# Patient Record
Sex: Female | Born: 1935 | Race: White | Hispanic: No | Smoking: Never smoker
Health system: Southern US, Community
[De-identification: ages and names within clinical notes are randomized; demographics above are authoritative.]

## PROBLEM LIST (undated history)

## (undated) DIAGNOSIS — F32A Depression, unspecified: Secondary | ICD-10-CM

## (undated) DIAGNOSIS — I10 Essential (primary) hypertension: Secondary | ICD-10-CM

## (undated) DIAGNOSIS — M720 Palmar fascial fibromatosis [Dupuytren]: Secondary | ICD-10-CM

## (undated) DIAGNOSIS — I499 Cardiac arrhythmia, unspecified: Secondary | ICD-10-CM

## (undated) DIAGNOSIS — I4892 Unspecified atrial flutter: Secondary | ICD-10-CM

## (undated) DIAGNOSIS — N200 Calculus of kidney: Secondary | ICD-10-CM

## (undated) DIAGNOSIS — F329 Major depressive disorder, single episode, unspecified: Secondary | ICD-10-CM

## (undated) HISTORY — PX: ABDOMINAL HYSTERECTOMY: SHX81

## (undated) HISTORY — PX: DUPUYTREN CONTRACTURE RELEASE: SHX1478

## (undated) HISTORY — DX: Unspecified atrial flutter: I48.92

---

## 2004-09-06 ENCOUNTER — Ambulatory Visit (HOSPITAL_COMMUNITY): Admission: RE | Admit: 2004-09-06 | Discharge: 2004-09-06 | Payer: Self-pay | Admitting: Ophthalmology

## 2005-02-07 ENCOUNTER — Ambulatory Visit (HOSPITAL_COMMUNITY): Admission: RE | Admit: 2005-02-07 | Discharge: 2005-02-07 | Payer: Self-pay | Admitting: Ophthalmology

## 2005-07-12 ENCOUNTER — Encounter: Admission: RE | Admit: 2005-07-12 | Discharge: 2005-07-12 | Payer: Self-pay | Admitting: Orthopedic Surgery

## 2005-07-13 ENCOUNTER — Ambulatory Visit (HOSPITAL_BASED_OUTPATIENT_CLINIC_OR_DEPARTMENT_OTHER): Admission: RE | Admit: 2005-07-13 | Discharge: 2005-07-13 | Payer: Self-pay | Admitting: Orthopedic Surgery

## 2005-08-03 ENCOUNTER — Encounter (HOSPITAL_COMMUNITY): Admission: RE | Admit: 2005-08-03 | Discharge: 2005-09-02 | Payer: Self-pay | Admitting: Orthopedic Surgery

## 2011-08-02 ENCOUNTER — Emergency Department (HOSPITAL_COMMUNITY)
Admission: EM | Admit: 2011-08-02 | Discharge: 2011-08-02 | Disposition: A | Discharge status: Home Health | Payer: Medicare Other | Attending: Emergency Medicine | Admitting: Emergency Medicine

## 2011-08-02 ENCOUNTER — Other Ambulatory Visit: Payer: Self-pay

## 2011-08-02 ENCOUNTER — Emergency Department (HOSPITAL_COMMUNITY): Payer: Medicare Other

## 2011-08-02 ENCOUNTER — Encounter (HOSPITAL_COMMUNITY): Payer: Self-pay

## 2011-08-02 DIAGNOSIS — R42 Dizziness and giddiness: Secondary | ICD-10-CM | POA: Insufficient documentation

## 2011-08-02 DIAGNOSIS — I498 Other specified cardiac arrhythmias: Secondary | ICD-10-CM | POA: Insufficient documentation

## 2011-08-02 DIAGNOSIS — I10 Essential (primary) hypertension: Secondary | ICD-10-CM | POA: Insufficient documentation

## 2011-08-02 DIAGNOSIS — R61 Generalized hyperhidrosis: Secondary | ICD-10-CM | POA: Insufficient documentation

## 2011-08-02 DIAGNOSIS — R079 Chest pain, unspecified: Secondary | ICD-10-CM | POA: Insufficient documentation

## 2011-08-02 HISTORY — DX: Essential (primary) hypertension: I10

## 2011-08-02 HISTORY — DX: Cardiac arrhythmia, unspecified: I49.9

## 2011-08-02 HISTORY — DX: Major depressive disorder, single episode, unspecified: F32.9

## 2011-08-02 HISTORY — DX: Depression, unspecified: F32.A

## 2011-08-02 LAB — BASIC METABOLIC PANEL
BUN: 19 mg/dL (ref 6–23)
CO2: 29 mEq/L (ref 19–32)
Calcium: 9.8 mg/dL (ref 8.4–10.5)
Chloride: 103 mEq/L (ref 96–112)
Creatinine, Ser: 0.67 mg/dL (ref 0.50–1.10)
GFR calc Af Amer: >90 mL/min (ref >90)
GFR calc non Af Amer: 79 mL/min — ABNORMAL LOW (ref >90)
Glucose, Bld: 152 mg/dL — ABNORMAL HIGH (ref 70–99)
Potassium: 4.2 mEq/L (ref 3.5–5.1)
Sodium: 140 mEq/L (ref 135–145)

## 2011-08-02 LAB — CBC
HCT: 38.7 % (ref 36.0–46.0)
Hemoglobin: 13.1 g/dL (ref 12.0–15.0)
MCH: 31.7 pg (ref 26.0–34.0)
MCHC: 33.9 g/dL (ref 30.0–36.0)
MCV: 93.7 fL (ref 78.0–100.0)
Platelets: 201 10*3/uL (ref 150–400)
RBC: 4.13 MIL/uL (ref 3.87–5.11)
RDW: 12.6 % (ref 11.5–15.5)
WBC: 7.6 10*3/uL (ref 4.0–10.5)

## 2011-08-02 LAB — TROPONIN I: Troponin I: <0.3 ng/mL (ref <0.30)

## 2011-08-02 MED ORDER — OXYCODONE-ACETAMINOPHEN 5-325 MG PO TABS
1.0000 | ORAL_TABLET | ORAL | Status: AC | PRN
Start: 2011-08-02 — End: 2011-08-12

## 2011-08-02 NOTE — ED Notes (Signed)
Pt reports severe chest pain under breast after eating.  Says became dizzy and diaphoretic  For a few mintues when pain started.    Says pain is severe in ribs.

## 2011-08-02 NOTE — Discharge Instructions (Signed)
Chest Pain, Nonspecific  It is often hard to give a specific diagnosis for the cause of chest pain. There is always a chance that your pain could be related to something serious, like a heart attack or a blood clot in the lungs. You need to follow up with your caregiver for further evaluation. More lab tests or other studies such as X-rays, electrocardiography, stress testing, or cardiac imaging may be needed to find the cause of your pain.  Most of the time, nonspecific chest pain improves within 2 to 3 days with rest and mild pain medicine. For the next few days, avoid physical exertion or activities that bring on pain. Do not smoke. Avoid drinking alcohol. Call your caregiver for routine follow-up as advised.   SEEK IMMEDIATE MEDICAL CARE IF:   You develop increased chest pain or pain that radiates to the arm, neck, jaw, back, or abdomen.   You develop shortness of breath, increased coughing, or you start coughing up blood.   You have severe back or abdominal pain, nausea, or vomiting.   You develop severe weakness, fainting, fever, or chills.  Document Released: 05/09/2005 Document Revised: 04/28/2011 Document Reviewed: 10/27/2006  [employer] Patient Information 2012 [employer].

## 2011-08-02 NOTE — ED Notes (Signed)
Pt DC to home with family via wc

## 2011-08-02 NOTE — ED Provider Notes (Signed)
History   This chart was scribed for Raeford Razor, MD by Clarita Crane. The patient was seen in room APA11/APA11. Patient's care was started at 1351.   CSN: [id number]  Arrival date & time 08/02/11  1351   First MD Initiated Contact with Patient 08/02/11 1414      Chief Complaint  Patient presents with  . Chest Pain    (Consider location/radiation/quality/duration/timing/severity/associated sxs/prior treatment) HPI [name] is a 78 y.o. female who presents to the Emergency Department complaining of constant moderate to severe chest pain localized to region under breasts onset this morning after eating with associated dizziness and diaphoresis which lasted for a few minutes after onset of chest pain and then resolved on its own. Denies SOB, nausea, vomiting, fever, chills, cough. Patient with h/o HTN, irregular heart beat.  Past Medical History  Diagnosis Date  . Hypertension   . Irregular heart beat   . Depression     Past Surgical History  Procedure Date  . Abdominal hysterectomy     No family history on file.  History  Substance Use Topics  . Smoking status: Never Smoker   . Smokeless tobacco: Not on file  . Alcohol Use: No    OB History    Grav Para Term Preterm Abortions TAB SAB Ect Mult Living                  Review of Systems 10 Systems reviewed and are negative for acute change except as noted in the HPI.  Allergies  Penicillins  Home Medications  No current outpatient prescriptions on file.  BP 140/66  Pulse 68  Temp(Src) 97.5 F (36.4 C) (Oral)  Resp 22  Ht 5\' 5"  (1.651 m)  Wt 133 lb (60.328 kg)  BMI 22.13 kg/m2  SpO2 99%  Physical Exam  Nursing note and vitals reviewed. Constitutional: She is oriented to person, place, and time. She appears well-developed and well-nourished. No distress.  HENT:  Head: Normocephalic and atraumatic.  Eyes: EOM are normal. Pupils are equal, round, and reactive to light.  Neck: Neck supple. No  tracheal deviation present.  Cardiovascular: Normal rate and regular rhythm.  Exam reveals no gallop and no friction rub.   No murmur heard. Pulmonary/Chest: Effort normal. No respiratory distress. She has no wheezes. She has no rales. She exhibits no tenderness.  Abdominal: Soft. She exhibits no distension. There is no tenderness.  Musculoskeletal: Normal range of motion. She exhibits no edema.  Neurological: She is alert and oriented to person, place, and time. No sensory deficit.  Skin: Skin is warm and dry.  Psychiatric: She has a normal mood and affect. Her behavior is normal.    ED Course  Procedures (including critical care time)  DIAGNOSTIC STUDIES: Oxygen Saturation is 99% on room air, normal by my interpretation.    COORDINATION OF CARE: 2:26PM- Patient informed of current plan for treatment and evaluation and agrees with plan at this time.     Labs Reviewed - No data to display No results found.  Dg Chest 2 View  08/02/2011  *RADIOLOGY REPORT*  Clinical Data: Chest pain after eating earlier today  CHEST - 2 VIEW  Comparison: 07/12/2005  Findings: Grossly unchanged cardiac silhouette and mediastinal contours with atherosclerotic calcifications within the aortic arch.  The lungs remain mildly hyperinflated.  Unchanged minimal left basilar opacities favored to represent atelectasis or scar. There is grossly unchanged biapical pleural parenchymal thickening. No definite pneumothorax or pleural effusion.  Unchanged bones including  multilevel thoracic spine degenerative change.  IMPRESSION: Hyperexpanded lungs without acute cardiopulmonary disease.  Original Report Authenticated By: Waynard Reeds, M.D.    1. Chest pain       MDM  78 year old female with chest pain shortly after eating. Consider ACS but doubt. Symptoms atypical. EKG non-provocative. Trop normal. cxr without focal acute findings. Possible hiatal hernia. Etiology unclear but doubt emergent etiology. Return  precautions discussed. Although doubt cardiac inetiology pt was encouraged to follow-up with her cardiologist since she has has not been to see recently and last stress test over 10y ago.       I personally preformed the services scribed in my presence. The recorded information has been reviewed and considered. Raeford Razor, MD.    Raeford Razor, MD 08/07/11 [phone]

## 2011-08-02 NOTE — ED Notes (Signed)
Patient in bed, placed patient on cardiac monitor. Family at bedside at this time. No needs voiced by patient.

## 2012-02-16 ENCOUNTER — Other Ambulatory Visit (HOSPITAL_COMMUNITY): Payer: Self-pay | Admitting: Internal Medicine

## 2012-02-16 DIAGNOSIS — R1011 Right upper quadrant pain: Secondary | ICD-10-CM

## 2012-02-21 ENCOUNTER — Other Ambulatory Visit (HOSPITAL_COMMUNITY): Payer: Self-pay | Admitting: Internal Medicine

## 2012-02-21 ENCOUNTER — Ambulatory Visit (HOSPITAL_COMMUNITY)
Admission: RE | Admit: 2012-02-21 | Discharge: 2012-02-21 | Disposition: A | Discharge status: Home / Self Care | Payer: Medicare Other | Source: Ambulatory Visit | Attending: Internal Medicine | Admitting: Internal Medicine

## 2012-02-21 DIAGNOSIS — R1011 Right upper quadrant pain: Secondary | ICD-10-CM | POA: Insufficient documentation

## 2012-02-21 DIAGNOSIS — K8020 Calculus of gallbladder without cholecystitis without obstruction: Secondary | ICD-10-CM | POA: Insufficient documentation

## 2012-02-21 DIAGNOSIS — K828 Other specified diseases of gallbladder: Secondary | ICD-10-CM | POA: Insufficient documentation

## 2012-03-01 NOTE — H&P (Signed)
  NTS SOAP Note  Vital Signs:  Vitals as of: 03/01/2012: Systolic 150: Diastolic 75: Heart Rate 92: Temp 57F: Height 87ft 5in: Weight 128Lbs 0 Ounces: BMI 21  BMI : 21.47 kg/m2  Subjective: This 78 Years 58 Months old Female presents for of biliary colic.  Has had two episodes of biliary colic.  U/S of gallbladder reveals cholelithiasis.  Does have fatty food intolerance.  No fever, chills, jaundice. .. Review of Symptoms:  Constitutional:unremarkable   Head:unremarkable    Eyes:unremarkable   sinus problems Cardiovascular:  unremarkable   Respiratory:unremarkable   Genitourinary:unremarkable     Musculoskeletal:unremarkable   Skin:unremarkable Hematolgic/Lymphatic:unremarkable     Allergic/Immunologic:unremarkable     Past Medical History:    Reviewed   Past Medical History  Surgical History: hysterectomy Medical Problems:  High Blood pressure, High cholesterol Allergies: pcn Medications: diltiazem, paroxetine, lisinopril, baby asa   Social History:Reviewed  Social History  Preferred Language: English ([place]) Race:  White Ethnicity: Not Hispanic / Latino Age: 78 Years 3 Months Alcohol:  No Recreational drug(s):  No   Smoking Status: Never smoker reviewed on 03/01/2012  Family History:  Reviewed   Family History  Is there a family history YN:[id number]    Objective Information: General:  Well appearing, well nourished in no distress.   no scleral icterus Heart:  RRR, no murmur or gallop.  Normal S1, S2.  No S3, S4.  Lungs:    CTA bilaterally, no wheezes, rhonchi, rales.  Breathing unlabored. Abdomen:Soft, NT/ND, normal bowel sounds, no HSM, no masses.  No peritoneal signs.  Assessment:Biliary colic, cholelithiasis  Diagnosis &amp; Procedure: DiagnosisCode: 574.20, ProcedureCode: 78469,    Plan:Scheduled for laparoscopic cholecystectomy on 03/19/12.   Patient  Education:Alternative treatments to surgery were discussed with patient (and family).  Risks and benefits  of procedure were fully explained to the patient (and family) who gave informed consent. Patient/family questions were addressed.  Follow-up:Pending Surgery

## 2012-03-07 ENCOUNTER — Encounter (HOSPITAL_COMMUNITY): Payer: Self-pay

## 2012-03-13 ENCOUNTER — Encounter (HOSPITAL_COMMUNITY)
Admission: RE | Admit: 2012-03-13 | Discharge: 2012-03-13 | Disposition: A | Discharge status: Home / Self Care | Payer: Medicare Other | Source: Ambulatory Visit | Attending: General Surgery | Admitting: General Surgery

## 2012-03-13 ENCOUNTER — Encounter (HOSPITAL_COMMUNITY): Payer: Self-pay

## 2012-03-13 HISTORY — DX: Palmar fascial fibromatosis (dupuytren): M72.0

## 2012-03-13 LAB — BASIC METABOLIC PANEL
BUN: 16 mg/dL (ref 6–23)
CO2: 28 mEq/L (ref 19–32)
Calcium: 9.9 mg/dL (ref 8.4–10.5)
Chloride: 102 mEq/L (ref 96–112)
Creatinine, Ser: 0.69 mg/dL (ref 0.50–1.10)

## 2012-03-13 LAB — CBC WITH DIFFERENTIAL/PLATELET
Basophils Absolute: 0 10*3/uL (ref 0.0–0.1)
Basophils Relative: 1 % (ref 0–1)
Eosinophils Absolute: 0.2 10*3/uL (ref 0.0–0.7)
Eosinophils Relative: 3 % (ref 0–5)
HCT: 39.5 % (ref 36.0–46.0)
MCH: 31.6 pg (ref 26.0–34.0)
MCHC: 33.2 g/dL (ref 30.0–36.0)
MCV: 95.4 fL (ref 78.0–100.0)
Monocytes Absolute: 0.4 10*3/uL (ref 0.1–1.0)
RDW: 13.3 % (ref 11.5–15.5)

## 2012-03-13 LAB — SURGICAL PCR SCREEN
MRSA, PCR: NEGATIVE
Staphylococcus aureus: NEGATIVE

## 2012-03-13 LAB — HEPATIC FUNCTION PANEL
ALT: 263 U/L — ABNORMAL HIGH (ref 0–35)
Bilirubin, Direct: 0.7 mg/dL — ABNORMAL HIGH (ref 0.0–0.3)
Total Protein: 6.8 g/dL (ref 6.0–8.3)

## 2012-03-13 NOTE — Patient Instructions (Addendum)
20 Rickia P Knippenberg  03/13/2012   Your procedure is scheduled on:  03/19/12  Report to [facility] at 0900 AM.  Call this number if you have problems the morning of surgery: [phone]   Remember:   Do not eat food:After Midnight.  May have clear liquids:until Midnight .  Clear liquids include soda, tea, black coffee, apple or grape juice, broth.  Take these medicines the morning of surgery with A SIP OF WATER: paxil, diltiazem, lisinopril   Do not wear jewelry, make-up or nail polish.  Do not wear lotions, powders, or perfumes. You may wear deodorant.  Do not shave 48 hours prior to surgery. Men may shave face and neck.  Do not bring valuables to the hospital.  Contacts, dentures or bridgework may not be worn into surgery.  Leave suitcase in the car. After surgery it may be brought to your room.  For patients admitted to the hospital, checkout time is 11:00 AM the day of discharge.   Patients discharged the day of surgery will not be allowed to drive home.  Name and phone number of your driver: family  Special Instructions: Shower using CHG 2 nights before surgery and the night before surgery.  If you shower the day of surgery use CHG.  Use special wash - you have one bottle of CHG for all showers.  You should use approximately 1/3 of the bottle for each shower.   Please read over the following fact sheets that you were given: Pain Booklet, MRSA Information, Surgical Site Infection Prevention, Anesthesia Post-op Instructions and Care and Recovery After Surgery   PATIENT INSTRUCTIONS POST-ANESTHESIA  IMMEDIATELY FOLLOWING SURGERY:  Do not drive or operate machinery for the first twenty four hours after surgery.  Do not make any important decisions for twenty four hours after surgery or while taking narcotic pain medications or sedatives.  If you develop intractable nausea and vomiting or a severe headache please notify your doctor immediately.  FOLLOW-UP:  Please make an appointment with  your surgeon as instructed. You do not need to follow up with anesthesia unless specifically instructed to do so.  WOUND CARE INSTRUCTIONS (if applicable):  Keep a dry clean dressing on the anesthesia/puncture wound site if there is drainage.  Once the wound has quit draining you may leave it open to air.  Generally you should leave the bandage intact for twenty four hours unless there is drainage.  If the epidural site drains for more than 36-48 hours please call the anesthesia department.  QUESTIONS?:  Please feel free to call your physician or the hospital operator if you have any questions, and they will be happy to assist you.      Laparoscopic Cholecystectomy Laparoscopic cholecystectomy is surgery to remove the gallbladder. The gallbladder is located slightly to the right of center in the abdomen, behind the liver. It is a concentrating and storage sac for the bile produced in the liver. Bile aids in the digestion and absorption of fats. Gallbladder disease (cholecystitis) is an inflammation of your gallbladder. This condition is usually caused by a buildup of gallstones (cholelithiasis) in your gallbladder. Gallstones can block the flow of bile, resulting in inflammation and pain. In severe cases, emergency surgery may be required. When emergency surgery is not required, you will have time to prepare for the procedure. Laparoscopic surgery is an alternative to open surgery. Laparoscopic surgery usually has a shorter recovery time. Your common bile duct may also need to be examined and explored. Your caregiver  will discuss this with you if he or she feels this should be done. If stones are found in the common bile duct, they may be removed. LET YOUR CAREGIVER KNOW ABOUT:  Allergies to food or medicine.  Medicines taken, including vitamins, herbs, eyedrops, over-the-counter medicines, and creams.  Use of steroids (by mouth or creams).  Previous problems with anesthetics or numbing  medicines.  History of bleeding problems or blood clots.  Previous surgery.  Other health problems, including diabetes and kidney problems.  Possibility of pregnancy, if this applies. RISKS AND COMPLICATIONS All surgery is associated with risks. Some problems that may occur following this procedure include:  Infection.  Damage to the common bile duct, nerves, arteries, veins, or other internal organs such as the stomach or intestines.  Bleeding.  A stone may remain in the common bile duct. BEFORE THE PROCEDURE  Do not take aspirin for 3 days prior to surgery or blood thinners for 1 week prior to surgery.  Do not eat or drink anything after midnight the night before surgery.  Let your caregiver know if you develop a cold or other infectious problem prior to surgery.  You should be present 60 minutes before the procedure or as directed. PROCEDURE  You will be given medicine that makes you sleep (general anesthetic). When you are asleep, your surgeon will make several small cuts (incisions) in your abdomen. One of these incisions is used to insert a small, lighted scope (laparoscope) into the abdomen. The laparoscope helps the surgeon see into your abdomen. Carbon dioxide gas will be pumped into your abdomen. The gas allows more room for the surgeon to perform your surgery. Other operating instruments are inserted through the other incisions. Laparoscopic procedures may not be appropriate when:  There is major scarring from previous surgery.  The gallbladder is extremely inflamed.  There are bleeding disorders or unexpected cirrhosis of the liver.  A pregnancy is near term.  Other conditions make the laparoscopic procedure impossible. If your surgeon feels it is not safe to continue with a laparoscopic procedure, he or she will perform an open abdominal procedure. In this case, the surgeon will make an incision to open the abdomen. This gives the surgeon a larger view and field  to work within. This may allow the surgeon to perform procedures that sometimes cannot be performed with a laparoscope alone. Open surgery has a longer recovery time. AFTER THE PROCEDURE  You will be taken to the recovery area where a nurse will watch and check your progress.  You may be allowed to go home the same day.  Do not resume physical activities until directed by your caregiver.  You may resume a normal diet and activities as directed. Document Released: 05/09/2005 Document Revised: 08/01/2011 Document Reviewed: 10/22/2010 [facility] Patient Information 2013 [place], [place].

## 2012-03-14 NOTE — Progress Notes (Signed)
Dr. Lovell Sheehan aware of elevated LFTs.  Dr. Lovell Sheehan added intraoperative cholangiogram to order & consent.

## 2012-03-19 ENCOUNTER — Encounter (HOSPITAL_COMMUNITY)
Admission: RE | Disposition: A | Discharge status: Home Health | Payer: Self-pay | Source: Ambulatory Visit | Attending: General Surgery

## 2012-03-19 ENCOUNTER — Encounter (HOSPITAL_COMMUNITY): Payer: Self-pay | Admitting: Anesthesiology

## 2012-03-19 ENCOUNTER — Inpatient Hospital Stay (HOSPITAL_COMMUNITY)
Admission: RE | Admit: 2012-03-19 | Discharge: 2012-03-22 | DRG: 419 | Disposition: A | Discharge status: Home Health | Payer: Medicare Other | Source: Ambulatory Visit | Attending: General Surgery | Admitting: General Surgery

## 2012-03-19 ENCOUNTER — Encounter (HOSPITAL_COMMUNITY): Payer: Self-pay | Admitting: *Deleted

## 2012-03-19 ENCOUNTER — Ambulatory Visit (HOSPITAL_COMMUNITY): Payer: Medicare Other

## 2012-03-19 ENCOUNTER — Ambulatory Visit (HOSPITAL_COMMUNITY): Payer: Medicare Other | Admitting: Anesthesiology

## 2012-03-19 DIAGNOSIS — E7800 Pure hypercholesterolemia, unspecified: Secondary | ICD-10-CM | POA: Diagnosis present

## 2012-03-19 DIAGNOSIS — Z79899 Other long term (current) drug therapy: Secondary | ICD-10-CM

## 2012-03-19 DIAGNOSIS — K8060 Calculus of gallbladder and bile duct with cholecystitis, unspecified, without obstruction: Principal | ICD-10-CM | POA: Diagnosis present

## 2012-03-19 DIAGNOSIS — F329 Major depressive disorder, single episode, unspecified: Secondary | ICD-10-CM | POA: Diagnosis present

## 2012-03-19 DIAGNOSIS — F3289 Other specified depressive episodes: Secondary | ICD-10-CM | POA: Diagnosis present

## 2012-03-19 DIAGNOSIS — M720 Palmar fascial fibromatosis [Dupuytren]: Secondary | ICD-10-CM | POA: Diagnosis present

## 2012-03-19 DIAGNOSIS — Z90710 Acquired absence of both cervix and uterus: Secondary | ICD-10-CM

## 2012-03-19 DIAGNOSIS — Z7982 Long term (current) use of aspirin: Secondary | ICD-10-CM

## 2012-03-19 DIAGNOSIS — Z880 Allergy status to penicillin: Secondary | ICD-10-CM

## 2012-03-19 DIAGNOSIS — I10 Essential (primary) hypertension: Secondary | ICD-10-CM | POA: Diagnosis present

## 2012-03-19 DIAGNOSIS — K8064 Calculus of gallbladder and bile duct with chronic cholecystitis without obstruction: Principal | ICD-10-CM | POA: Diagnosis present

## 2012-03-19 DIAGNOSIS — E638 Other specified nutritional deficiencies: Secondary | ICD-10-CM | POA: Diagnosis present

## 2012-03-19 HISTORY — PX: CHOLECYSTECTOMY: SHX55

## 2012-03-19 HISTORY — PX: LAPAROSCOPIC CHOLECYSTECTOMY: SUR755

## 2012-03-19 LAB — HEPATIC FUNCTION PANEL
AST: 151 U/L — ABNORMAL HIGH (ref 0–37)
Albumin: 3.3 g/dL — ABNORMAL LOW (ref 3.5–5.2)
Alkaline Phosphatase: 300 U/L — ABNORMAL HIGH (ref 39–117)
Total Protein: 6.1 g/dL (ref 6.0–8.3)

## 2012-03-19 LAB — GLUCOSE, CAPILLARY: Glucose-Capillary: 191 mg/dL — ABNORMAL HIGH (ref 70–99)

## 2012-03-19 LAB — BASIC METABOLIC PANEL
Calcium: 9 mg/dL (ref 8.4–10.5)
Glucose, Bld: 196 mg/dL — ABNORMAL HIGH (ref 70–99)

## 2012-03-19 SURGERY — LAPAROSCOPIC CHOLECYSTECTOMY WITH INTRAOPERATIVE CHOLANGIOGRAM
Anesthesia: General | Wound class: Clean Contaminated

## 2012-03-19 MED ORDER — PAROXETINE HCL 20 MG PO TABS
10.0000 mg | ORAL_TABLET | Freq: Every day | ORAL | Status: DC
  Administered 2012-03-20 – 2012-03-22 (×3): 10 mg via ORAL
  Filled 2012-03-19: qty 2
  Filled 2012-03-19 (×2): qty 1

## 2012-03-19 MED ORDER — GLYCOPYRROLATE 0.2 MG/ML IJ SOLN
INTRAMUSCULAR | Status: AC
  Filled 2012-03-19: qty 1

## 2012-03-19 MED ORDER — NEOSTIGMINE METHYLSULFATE 1 MG/ML IJ SOLN
INTRAMUSCULAR | Status: DC | PRN
  Administered 2012-03-19: 4 mg via INTRAVENOUS

## 2012-03-19 MED ORDER — MIDAZOLAM HCL 2 MG/2ML IJ SOLN
INTRAMUSCULAR | Status: AC
  Filled 2012-03-19: qty 2

## 2012-03-19 MED ORDER — PANTOPRAZOLE SODIUM 40 MG IV SOLR
40.0000 mg | INTRAVENOUS | Status: DC
  Administered 2012-03-19 – 2012-03-21 (×3): 40 mg via INTRAVENOUS
  Filled 2012-03-19 (×3): qty 40

## 2012-03-19 MED ORDER — ONDANSETRON HCL 4 MG/2ML IJ SOLN
4.0000 mg | Freq: Once | INTRAMUSCULAR | Status: AC
  Administered 2012-03-19: 4 mg via INTRAVENOUS

## 2012-03-19 MED ORDER — BUPIVACAINE HCL (PF) 0.5 % IJ SOLN
INTRAMUSCULAR | Status: AC
  Filled 2012-03-19: qty 30

## 2012-03-19 MED ORDER — ACETAMINOPHEN 10 MG/ML IV SOLN
INTRAVENOUS | Status: AC
  Filled 2012-03-19: qty 100

## 2012-03-19 MED ORDER — MIDAZOLAM HCL 2 MG/2ML IJ SOLN
1.0000 mg | INTRAMUSCULAR | Status: DC | PRN
  Administered 2012-03-19: 2 mg via INTRAVENOUS

## 2012-03-19 MED ORDER — IOHEXOL 350 MG/ML SOLN
INTRAVENOUS | Status: DC | PRN
  Administered 2012-03-19 (×4): 50 mL

## 2012-03-19 MED ORDER — FENTANYL CITRATE 0.05 MG/ML IJ SOLN
INTRAMUSCULAR | Status: AC
  Filled 2012-03-19: qty 2

## 2012-03-19 MED ORDER — CHLORHEXIDINE GLUCONATE 4 % EX LIQD: 1.0000 "application " | Freq: Once | CUTANEOUS | Status: DC

## 2012-03-19 MED ORDER — FENTANYL CITRATE 0.05 MG/ML IJ SOLN
INTRAMUSCULAR | Status: DC | PRN
  Administered 2012-03-19: 25 ug via INTRAVENOUS
  Administered 2012-03-19: 50 ug via INTRAVENOUS
  Administered 2012-03-19: 25 ug via INTRAVENOUS
  Administered 2012-03-19: 50 ug via INTRAVENOUS
  Administered 2012-03-19 (×2): 25 ug via INTRAVENOUS

## 2012-03-19 MED ORDER — METOPROLOL TARTRATE 1 MG/ML IV SOLN
INTRAVENOUS | Status: DC | PRN
  Administered 2012-03-19 (×2): 1 mg via INTRAVENOUS

## 2012-03-19 MED ORDER — GLYCOPYRROLATE 0.2 MG/ML IJ SOLN
INTRAMUSCULAR | Status: AC
  Filled 2012-03-19: qty 3

## 2012-03-19 MED ORDER — KCL IN DEXTROSE-NACL 40-5-0.45 MEQ/L-%-% IV SOLN
INTRAVENOUS | Status: DC
  Administered 2012-03-19 – 2012-03-21 (×4): via INTRAVENOUS

## 2012-03-19 MED ORDER — VANCOMYCIN HCL IN DEXTROSE 1-5 GM/200ML-% IV SOLN: 1000.0000 mg | INTRAVENOUS | Status: DC

## 2012-03-19 MED ORDER — FENTANYL CITRATE 0.05 MG/ML IJ SOLN
25.0000 ug | INTRAMUSCULAR | Status: DC | PRN
  Administered 2012-03-19 (×2): 50 ug via INTRAVENOUS

## 2012-03-19 MED ORDER — VANCOMYCIN HCL IN DEXTROSE 1-5 GM/200ML-% IV SOLN
INTRAVENOUS | Status: AC
  Filled 2012-03-19: qty 200

## 2012-03-19 MED ORDER — ACETAMINOPHEN 10 MG/ML IV SOLN
1000.0000 mg | Freq: Four times a day (QID) | INTRAVENOUS | Status: AC
  Administered 2012-03-19 – 2012-03-20 (×4): 1000 mg via INTRAVENOUS
  Filled 2012-03-19 (×3): qty 100

## 2012-03-19 MED ORDER — NEOSTIGMINE METHYLSULFATE 1 MG/ML IJ SOLN
INTRAMUSCULAR | Status: AC
  Filled 2012-03-19: qty 10

## 2012-03-19 MED ORDER — GLYCOPYRROLATE 0.2 MG/ML IJ SOLN
INTRAMUSCULAR | Status: DC | PRN
  Administered 2012-03-19: 0.6 mg via INTRAVENOUS

## 2012-03-19 MED ORDER — LIDOCAINE HCL (CARDIAC) 10 MG/ML IV SOLN
INTRAVENOUS | Status: DC | PRN
  Administered 2012-03-19: 50 mg via INTRAVENOUS

## 2012-03-19 MED ORDER — VANCOMYCIN HCL 1000 MG IV SOLR
1000.0000 mg | INTRAVENOUS | Status: DC | PRN
  Administered 2012-03-19: 1000 mg via INTRAVENOUS

## 2012-03-19 MED ORDER — LIDOCAINE HCL (PF) 1 % IJ SOLN
INTRAMUSCULAR | Status: AC
  Filled 2012-03-19: qty 2

## 2012-03-19 MED ORDER — PROPOFOL 10 MG/ML IV EMUL
INTRAVENOUS | Status: AC
  Filled 2012-03-19: qty 20

## 2012-03-19 MED ORDER — ONDANSETRON HCL 4 MG/2ML IJ SOLN: 4.0000 mg | Freq: Once | INTRAMUSCULAR | Status: DC | PRN

## 2012-03-19 MED ORDER — ROCURONIUM BROMIDE 50 MG/5ML IV SOLN
INTRAVENOUS | Status: AC
  Filled 2012-03-19: qty 1

## 2012-03-19 MED ORDER — GLUCAGON HCL (RDNA) 1 MG IJ SOLR
INTRAMUSCULAR | Status: DC | PRN
  Administered 2012-03-19: 1 mg via INTRAVENOUS

## 2012-03-19 MED ORDER — ENOXAPARIN SODIUM 40 MG/0.4ML ~~LOC~~ SOLN: 40.0000 mg | SUBCUTANEOUS | Status: DC

## 2012-03-19 MED ORDER — HEMOSTATIC AGENTS (NO CHARGE) OPTIME
TOPICAL | Status: DC | PRN
  Administered 2012-03-19 (×2): 1 via TOPICAL

## 2012-03-19 MED ORDER — MORPHINE SULFATE 2 MG/ML IJ SOLN: 1.0000 mg | INTRAMUSCULAR | Status: DC | PRN

## 2012-03-19 MED ORDER — HYDROCODONE-ACETAMINOPHEN 5-325 MG PO TABS: 1.0000 | ORAL_TABLET | ORAL | Status: DC | PRN

## 2012-03-19 MED ORDER — ROCURONIUM BROMIDE 100 MG/10ML IV SOLN
INTRAVENOUS | Status: DC | PRN
  Administered 2012-03-19: 30 mg via INTRAVENOUS
  Administered 2012-03-19: 5 mg via INTRAVENOUS

## 2012-03-19 MED ORDER — DILTIAZEM HCL ER COATED BEADS 240 MG PO CP24
240.0000 mg | ORAL_CAPSULE | Freq: Every day | ORAL | Status: DC
  Administered 2012-03-20 – 2012-03-21 (×2): 240 mg via ORAL
  Filled 2012-03-19 (×3): qty 1

## 2012-03-19 MED ORDER — LACTATED RINGERS IV SOLN
INTRAVENOUS | Status: DC
  Administered 2012-03-19: 10:00:00 via INTRAVENOUS

## 2012-03-19 MED ORDER — LACTATED RINGERS IV SOLN
INTRAVENOUS | Status: DC | PRN
  Administered 2012-03-19 (×2): via INTRAVENOUS

## 2012-03-19 MED ORDER — ONDANSETRON HCL 4 MG PO TABS: 4.0000 mg | ORAL_TABLET | Freq: Four times a day (QID) | ORAL | Status: DC | PRN

## 2012-03-19 MED ORDER — LIDOCAINE HCL (PF) 1 % IJ SOLN
INTRAMUSCULAR | Status: AC
  Filled 2012-03-19: qty 5

## 2012-03-19 MED ORDER — PROPOFOL 10 MG/ML IV EMUL
INTRAVENOUS | Status: DC | PRN
  Administered 2012-03-19: 80 mg via INTRAVENOUS

## 2012-03-19 MED ORDER — ONDANSETRON HCL 4 MG/2ML IJ SOLN
4.0000 mg | Freq: Four times a day (QID) | INTRAMUSCULAR | Status: DC | PRN
  Administered 2012-03-20: 4 mg via INTRAVENOUS
  Filled 2012-03-19: qty 2

## 2012-03-19 MED ORDER — LISINOPRIL 10 MG PO TABS
40.0000 mg | ORAL_TABLET | Freq: Every day | ORAL | Status: DC
  Administered 2012-03-20 – 2012-03-21 (×2): 40 mg via ORAL
  Filled 2012-03-19 (×3): qty 4

## 2012-03-19 MED ORDER — 0.9 % SODIUM CHLORIDE (POUR BTL) OPTIME
TOPICAL | Status: DC | PRN
  Administered 2012-03-19 (×2): 1000 mL

## 2012-03-19 MED ORDER — GLYCOPYRROLATE 0.2 MG/ML IJ SOLN
0.2000 mg | Freq: Once | INTRAMUSCULAR | Status: AC
  Administered 2012-03-19: 0.2 mg via INTRAVENOUS

## 2012-03-19 SURGICAL SUPPLY — 50 items
0.5% SENSORCAINE 30ML IMPLANT
APPLIER CLIP ROT 10 11.4 M/L (STAPLE) ×2
APR CLP MED LRG 11.4X10 (STAPLE) ×1
BAG HAMPER (MISCELLANEOUS) ×2 IMPLANT
BAG SPEC RTRVL LRG 6X4 10 (ENDOMECHANICALS) ×1
BASKET LAP CBDE (MISCELLANEOUS) ×1 IMPLANT
CATH CHOLANGIOGRAM 4.5FR (CATHETERS) ×2 IMPLANT
CLIP APPLIE ROT 10 11.4 M/L (STAPLE) ×1 IMPLANT
CLOTH BEACON ORANGE TIMEOUT ST (SAFETY) ×2 IMPLANT
COVER LIGHT HANDLE STERIS (MISCELLANEOUS) ×4 IMPLANT
COVER MAYO STAND XLG (DRAPE) ×2 IMPLANT
CUTTER LINEAR ENDO 35 ETS TH (STAPLE) ×1 IMPLANT
DECANTER SPIKE VIAL GLASS SM (MISCELLANEOUS) ×2 IMPLANT
DISSECTOR BLUNT TIP ENDO 5MM (MISCELLANEOUS) IMPLANT
DRAPE C-ARM FOLDED MOBILE STRL (DRAPES) ×2 IMPLANT
DURAPREP 26ML APPLICATOR (WOUND CARE) ×2 IMPLANT
ELECT REM PT RETURN 9FT ADLT (ELECTROSURGICAL) ×2
ELECTRODE REM PT RTRN 9FT ADLT (ELECTROSURGICAL) ×1 IMPLANT
FILTER SMOKE EVAC LAPAROSHD (FILTER) ×2 IMPLANT
FORMALIN 10 PREFIL 120ML (MISCELLANEOUS) ×2 IMPLANT
GLOVE BIO SURGEON STRL SZ7.5 (GLOVE) ×2 IMPLANT
GLOVE BIOGEL PI IND STRL 7.0 (GLOVE) IMPLANT
GLOVE BIOGEL PI IND STRL 7.5 (GLOVE) IMPLANT
GLOVE BIOGEL PI INDICATOR 7.0 (GLOVE) ×3
GLOVE BIOGEL PI INDICATOR 7.5 (GLOVE) ×2
GLOVE ECLIPSE 7.0 STRL STRAW (GLOVE) ×2 IMPLANT
GLOVE SS BIOGEL STRL SZ 6.5 (GLOVE) IMPLANT
GLOVE SUPERSENSE BIOGEL SZ 6.5 (GLOVE) ×3
GOWN STRL REIN XL XLG (GOWN DISPOSABLE) ×7 IMPLANT
HEMOSTAT SNOW SURGICEL 2X4 (HEMOSTASIS) ×3 IMPLANT
INST SET LAPROSCOPIC AP (KITS) ×2 IMPLANT
KIT ROOM TURNOVER APOR (KITS) ×2 IMPLANT
KIT TROCAR LAP CHOLE (TROCAR) ×2 IMPLANT
MANIFOLD NEPTUNE II (INSTRUMENTS) ×2 IMPLANT
NS IRRIG 1000ML POUR BTL (IV SOLUTION) ×2 IMPLANT
PACK LAP CHOLE LZT030E (CUSTOM PROCEDURE TRAY) ×2 IMPLANT
PAD ARMBOARD 7.5X6 YLW CONV (MISCELLANEOUS) ×2 IMPLANT
PENCIL FOOT CONTROL (ELECTRODE) ×1 IMPLANT
PENCIL HANDSWITCHING (ELECTRODE) ×1 IMPLANT
POUCH SPECIMEN RETRIEVAL 10MM (ENDOMECHANICALS) ×2 IMPLANT
SET BASIN LINEN APH (SET/KITS/TRAYS/PACK) ×2 IMPLANT
SET TUBE IRRIG SUCTION NO TIP (IRRIGATION / IRRIGATOR) IMPLANT
SPONGE GAUZE 2X2 8PLY STRL LF (GAUZE/BANDAGES/DRESSINGS) ×8 IMPLANT
STAPLER VISISTAT (STAPLE) ×2 IMPLANT
SUT VICRYL 0 UR6 27IN ABS (SUTURE) ×3 IMPLANT
SYR 20CC LL (SYRINGE) ×2 IMPLANT
SYR 30ML LL (SYRINGE) ×2 IMPLANT
TAPE CLOTH SURG 4X10 WHT LF (GAUZE/BANDAGES/DRESSINGS) ×1 IMPLANT
WARMER LAPAROSCOPE (MISCELLANEOUS) ×2 IMPLANT
YANKAUER SUCT 12FT TUBE ARGYLE (SUCTIONS) ×2 IMPLANT

## 2012-03-19 NOTE — Progress Notes (Signed)
Dr. Lovell Sheehan paged regarding order for telemetry monitoring. No return page at this time.

## 2012-03-19 NOTE — Anesthesia Procedure Notes (Signed)
Procedure Name: Intubation Date/Time: 03/19/2012 11:06 AM Performed by: Carolyne Littles, Jaidyn Usery L Pre-anesthesia Checklist: Patient identified, Patient being monitored, Timeout performed, Emergency Drugs available and Suction available Patient Re-evaluated:Patient Re-evaluated prior to inductionOxygen Delivery Method: Circle System Utilized Preoxygenation: Pre-oxygenation with 100% oxygen Intubation Type: IV induction Ventilation: Mask ventilation without difficulty Laryngoscope Size: 3 and Miller Grade View: Grade I Tube type: Oral Tube size: 7.0 mm Number of attempts: 1 Airway Equipment and Method: stylet Placement Confirmation: ETT inserted through vocal cords under direct vision,  positive ETCO2 and breath sounds checked- equal and bilateral Secured at: 21 cm Tube secured with: Tape Dental Injury: Teeth and Oropharynx as per pre-operative assessment

## 2012-03-19 NOTE — Progress Notes (Signed)
b-met and calcium drawn per lab.

## 2012-03-19 NOTE — Progress Notes (Signed)
EKG done

## 2012-03-19 NOTE — Preoperative (Signed)
Beta Blockers   Reason not to administer Beta Blockers:Not Applicable 

## 2012-03-19 NOTE — Interval H&P Note (Signed)
History and Physical Interval Note:  03/19/2012 9:36 AM  [name]  has presented today for surgery, with the diagnosis of Cholelithiasis  The various methods of treatment have been discussed with the patient and family. After consideration of risks, benefits and other options for treatment, the patient has consented to  Procedure(s) (LRB) with comments: LAPAROSCOPIC CHOLECYSTECTOMY WITH INTRAOPERATIVE CHOLANGIOGRAM (N/A) as a surgical intervention .  The patient's history has been reviewed, patient examined, no change in status, stable for surgery.  I have reviewed the patient's chart and labs.  Questions were answered to the patient's satisfaction.     Franky Macho A

## 2012-03-19 NOTE — Anesthesia Preprocedure Evaluation (Signed)
Anesthesia Evaluation  Patient identified by MRN, date of birth, ID band Patient awake    Reviewed: Allergy & Precautions, H&P , NPO status , Patient's Chart, lab work & pertinent test results  History of Anesthesia Complications Negative for: history of anesthetic complications  Airway Mallampati: II      Dental  (+) Teeth Intact   Pulmonary neg pulmonary ROS,  breath sounds clear to auscultation        Cardiovascular hypertension, Pt. on medications Rhythm:Regular Rate:Normal     Neuro/Psych PSYCHIATRIC DISORDERS Depression    GI/Hepatic   Endo/Other  diabetes (borderline)  Renal/GU      Musculoskeletal   Abdominal   Peds  Hematology   Anesthesia Other Findings   Reproductive/Obstetrics                           Anesthesia Physical Anesthesia Plan  ASA: III  Anesthesia Plan: General   Post-op Pain Management:    Induction: Intravenous  Airway Management Planned: Oral ETT  Additional Equipment:   Intra-op Plan:   Post-operative Plan: Extubation in OR  Informed Consent: I have reviewed the patients History and Physical, chart, labs and discussed the procedure including the risks, benefits and alternatives for the proposed anesthesia with the patient or authorized representative who has indicated his/her understanding and acceptance.     Plan Discussed with:   Anesthesia Plan Comments:         Anesthesia Quick Evaluation

## 2012-03-19 NOTE — Progress Notes (Signed)
Cardiac monitor. NSR with occasional PVC.

## 2012-03-19 NOTE — Op Note (Signed)
Patient:  [name]  DOB:  1925/07/24  MRN:  [id number]   Preop Diagnosis:  Cholecystitis, cholelithiasis  Postop Diagnosis:  Same, choledocholithiasis  Procedure:  Laparoscopic cholecystectomy with common bile duct exploration  Surgeon:  Franky Macho, M.D.  Anes:  General endotracheal  Indications:  Patient is an 78 year old white female who presents with biliary colic secondary to cholelithiasis. She was noted to have a dilated common bile duct on ultrasound, though no choledocholithiasis was seen. She does have a mildly elevated direct bilirubin. She now comes the operating room for laparoscopic cholecystectomy with intraoperative cholangiograms. The risks and benefits of the procedure including bleeding, infection, hepatobiliary injury, the possibly of an open procedure were fully explained to the patient, gave informed consent.  Procedure note:  The patient was placed in the supine position. After induction of general endotracheal anesthesia, the abdomen was prepped and draped using usual sterile technique with DuraPrep. Surgical site confirmation was performed.  A supraumbilical incision was made down the fascia. A Veress needle was introduced into the abdominal cavity and confirmation of placement was done using the saline drop test. The abdomen was insufflated to 16 mm mercury pressure. An 11 mm trocar was introduced into the abdominal cavity under direct visualization without difficulty. The patient was placed in reverse Trendelenburg position and additional 11 mm trocar was placed the epigastric region and 5 mm trochars were placed the right upper quadrant and right flank regions. The liver was inspected and noted within normal limits. The gallbladder was noted be distended and thickened with multiple stones present within the gallbladder lumen. The gallbladder was retracted in a dynamic fashion in order to facilitate identification of the triangle of Calot. The cystic duct was  first identified. Its junction to the infundibulum flow identified. He was noted to be significantly dilated down to a dilated common bile duct. A 'was placed proximally on the cystic duct. An incision was made into the cystic duct and a cholangiogram was performed under digital fluoroscopy. Multiple filling defects were noted within the common bile duct and hepatic radicals. The dye did flow into the duodenum. Glucagon 1 mg was given IV. Despite multiple attempts at flushing the system with normal saline as well as attempted trans-cystic basket retrieval, not all the stones could be cleared. Given the amount of stones and the size of the common bile duct, I felt it would better to complete the procedure laparoscopically and refer the patient for an ERCP postoperatively. A vascular Endo GIA was placed across the cystic duct and fired. The cystic artery was ligated and divided with clips. The gallbladder was freed away from the gallbladder fossa using Bovie electrocautery. The gallbladder was delivered through the epigastric trocar site using an Endo Catch bag. The gallbladder fossa was inspected no abnormal bleeding or bile leakage was noted. Surgicel is placed the gallbladder fossa. All fluid and air were then evacuated from the abdominal cavity prior to removal of the trochars.  All wounds were gave normal saline. The supraumbilical fascia as well as epigastric fascia reapproximated using 0 Vicryl interrupted sutures. All skin incisions were closed using staples. Betadine ointment and dry sterile dressings were applied.  All tape and needle counts were correct at the end of the procedure. Patient was extubated in the operating room and went to PACU in stable condition.  Of note was the fact that the patient had a long of possible V. tach. Labs in a 12-lead EKG will be checked. She'll be placed on telemetry  for further monitoring. No pressure change was noted throughout the procedure.  Complications:   None  EBL:  Minimal  Specimen:  Gallbladder with stones

## 2012-03-19 NOTE — Anesthesia Postprocedure Evaluation (Signed)
  Anesthesia Post-op Note  Patient: [name]  Procedure(s) Performed: Procedure(s) (LRB) with comments: LAPAROSCOPIC CHOLECYSTECTOMY WITH INTRAOPERATIVE CHOLANGIOGRAM (N/A) - with common duct exploration  Patient Location: PACU  Anesthesia Type: General  Level of Consciousness: awake, alert , oriented and patient cooperative  Airway and Oxygen Therapy: Patient Spontanous Breathing and Patient connected to face mask oxygen  Post-op Pain: mild  Post-op Assessment: Post-op Vital signs reviewed, Patient's Cardiovascular Status Stable, Respiratory Function Stable, Patent Airway and No signs of Nausea or vomiting  Post-op Vital Signs: Reviewed and stable  Complications: No apparent anesthesia complications

## 2012-03-19 NOTE — Progress Notes (Signed)
Sleeping quietly. resp adequate/nonlabored. O2 changed to nasal cannula. Tolerated well.

## 2012-03-19 NOTE — Transfer of Care (Signed)
  Anesthesia Post-op Note  Patient: [name]  Procedure(s) Performed: Procedure(s) (LRB) with comments: LAPAROSCOPIC CHOLECYSTECTOMY WITH INTRAOPERATIVE CHOLANGIOGRAM (N/A) - with common duct exploration  Patient Location: PACU  Anesthesia Type: General  Level of Consciousness: awake, alert , oriented and patient cooperative  Airway and Oxygen Therapy: Patient Spontanous Breathing and Patient connected to face mask oxygen  Post-op Pain: mild  Post-op Assessment: Post-op Vital signs reviewed, Patient's Cardiovascular Status Stable, Respiratory Function Stable, Patent Airway and No signs of Nausea or vomiting  Post-op Vital Signs: Reviewed and stable  Complications: No apparent anesthesia complications  

## 2012-03-19 NOTE — Interval H&P Note (Signed)
History and Physical Interval Note:  03/19/2012 8:59 AM  [name]  has presented today for surgery, with the diagnosis of Cholelithiasis  The various methods of treatment have been discussed with the patient and family. After consideration of risks, benefits and other options for treatment, the patient has consented to  Procedure(s) (LRB) with comments: LAPAROSCOPIC CHOLECYSTECTOMY WITH INTRAOPERATIVE CHOLANGIOGRAM (N/A) as a surgical intervention .  The patient's history has been reviewed, patient examined, no change in status, stable for surgery.  I have reviewed the patient's chart and labs.  Questions were answered to the patient's satisfaction.     Franky Macho A

## 2012-03-19 NOTE — Consult Note (Signed)
Referring Provider: No ref. provider found Primary Care Physician:  Carylon Perches, MD Primary Gastroenterologist:  Dr. Jena Gauss  Reason for Consultation:  Choledocholithiasis  HPI: [name] is a 78 y.o. female with biliary colic s/p laparoscopic cholecystectomy by Dr Lovell Sheehan today.  She had a positive intraoperative cholangiogram.  Contrast does pass into the small bowel.  Therefore, no complete obstruction.  Findings concerning for multiple retained common bile duct stones with CBD dilated.  Transaminases were elevated as below on 10/22.  Today's LFTs remain elevated, just slightly improved.  Pt is still somewhat sedated from procedure.  Pt states she has been having several months of abdominal pain that is worse with eating.  She thinks she has lost a lot of weight & had a poor appetite.  She denies fever, chills, jaundice or pruritis.    Past Medical History  Diagnosis Date  . Hypertension   . Irregular heart beat   . Depression   . Dupuytren's contracture of both hands     Past Surgical History  Procedure Date  . Abdominal hysterectomy   . Dupuytren contracture release     left hand  . Laparoscopic cholecystectomy 03/19/12    Lovell Sheehan, +IOC    Prior to Admission medications   Medication Sig Start Date End Date Taking? Authorizing Provider  diltiazem (CARDIZEM CD) 240 MG 24 hr capsule Take 1 capsule by mouth Daily. 02/20/12  Yes Historical Provider, MD  lisinopril (PRINIVIL,ZESTRIL) 40 MG tablet Take 40 mg by mouth daily.   Yes Historical Provider, MD  PARoxetine (PAXIL) 20 MG tablet Take 10 mg by mouth daily.    Yes Historical Provider, MD  aspirin EC 81 MG tablet Take 81 mg by mouth daily.    Historical Provider, MD  Polyethyl Glycol-Propyl Glycol (SYSTANE) 0.4-0.3 % SOLN Place 1 drop into both eyes daily as needed. For dry eyes    Historical Provider, MD  sodium chloride (OCEAN) 0.65 % nasal spray Place 1 spray into the nose as needed. For nasal dryness    Historical Provider, MD      Current Facility-Administered Medications  Medication Dose Route Frequency Provider Last Rate Last Dose  . acetaminophen (OFIRMEV) IV 1,000 mg  1,000 mg Intravenous Q6H Dalia Heading, MD   1,000 mg at 03/19/12 1334  . dextrose 5 % and 0.45 % NaCl with KCl 40 mEq/L infusion   Intravenous Continuous Dalia Heading, MD      . diltiazem (CARDIZEM CD) 24 hr capsule 240 mg  240 mg Oral Daily Dalia Heading, MD      . enoxaparin (LOVENOX) injection 40 mg  40 mg Subcutaneous Q24H Dalia Heading, MD      . glycopyrrolate (ROBINUL) injection 0.2 mg  0.2 mg Intravenous Once Laurene Footman, MD   0.2 mg at 03/19/12 1008  . HYDROcodone-acetaminophen (NORCO/VICODIN) 5-325 MG per tablet 1 tablet  1 tablet Oral Q4H PRN Dalia Heading, MD      . lisinopril (PRINIVIL,ZESTRIL) tablet 40 mg  40 mg Oral Daily Dalia Heading, MD      . morphine 2 MG/ML injection 1 mg  1 mg Intravenous Q3H PRN Dalia Heading, MD      . ondansetron [facility]) injection 4 mg  4 mg Intravenous Once Laurene Footman, MD   4 mg at 03/19/12 1009  . ondansetron (ZOFRAN) tablet 4 mg  4 mg Oral Q6H PRN Dalia Heading, MD       Or  . ondansetron [facility]) injection  4 mg  4 mg Intravenous Q6H PRN Dalia Heading, MD      . PARoxetine (PAXIL) tablet 10 mg  10 mg Oral Daily Dalia Heading, MD      . DISCONTD: 0.9 % irrigation (POUR BTL)    PRN Dalia Heading, MD   1,000 mL at 03/19/12 1215  . DISCONTD: chlorhexidine (HIBICLENS) 4 % liquid 1 application  1 application Topical Once Dalia Heading, MD      . DISCONTD: fentaNYL (SUBLIMAZE) injection 25-50 mcg  25-50 mcg Intravenous Q5 min PRN Laurene Footman, MD   50 mcg at 03/19/12 1340  . DISCONTD: hemostatic agents    PRN Dalia Heading, MD   1 application at 03/19/12 1239  . DISCONTD: iohexol (OMNIPAQUE) 350 MG/ML injection    PRN Dalia Heading, MD   50 mL at 03/19/12 1227  . DISCONTD: lactated ringers infusion   Intravenous Continuous Laurene Footman, MD 75 mL/hr at 03/19/12 1009    . DISCONTD:  midazolam (VERSED) injection 1-2 mg  1-2 mg Intravenous Q5 Min x 3 PRN Laurene Footman, MD   2 mg at 03/19/12 1009  . DISCONTD: ondansetron (ZOFRAN) injection 4 mg  4 mg Intravenous Once PRN Laurene Footman, MD      . DISCONTD: vancomycin (VANCOCIN) IVPB 1000 mg/200 mL premix  1,000 mg Intravenous On Call to OR Dalia Heading, MD       Facility-Administered Medications Ordered in Other Encounters  Medication Dose Route Frequency Provider Last Rate Last Dose  . DISCONTD: fentaNYL (SUBLIMAZE) injection    PRN Marolyn Hammock, CRNA   25 mcg at 03/19/12 1251  . DISCONTD: glucagon (GLUCAGEN) injection    PRN Amy Storm Frisk, CRNA   1 mg at 03/19/12 1155  . DISCONTD: glycopyrrolate (ROBINUL) injection    PRN Amy L Andraza, CRNA   0.6 mg at 03/19/12 1247  . DISCONTD: lactated ringers infusion    Continuous PRN Amy L Andraza, CRNA      . DISCONTD: lidocaine (cardiac) 50 mg/66ml (XYLOCAINE) injection 1%    PRN Amy L Andraza, CRNA   50 mg at 03/19/12 1104  . DISCONTD: metoprolol (LOPRESSOR) injection    PRN Amy Storm Frisk, CRNA   1 mg at 03/19/12 1250  . DISCONTD: neostigmine (PROSTIGMINE) injection   Intravenous PRN Amy Storm Frisk, CRNA   4 mg at 03/19/12 1247  . DISCONTD: propofol (DIPRIVAN) 10 mg/ml infusion    PRN Amy L Andraza, CRNA   80 mg at 03/19/12 1104  . DISCONTD: rocuronium (ZEMURON) injection    PRN Amy Storm Frisk, CRNA   5 mg at 03/19/12 1153  . DISCONTD: vancomycin (VANCOCIN) 1,000 mg in sodium chloride 0.9 % 250 mL IVPB  1,000 mg Intravenous Continuous PRN Amy L Andraza, CRNA   1,000 mg at 03/19/12 1050    Allergies as of 03/01/2012 - Review Complete 08/02/2011  Allergen Reaction Noted  . Penicillins  08/02/2011  . Procaine hcl  08/02/2011    History reviewed. No pertinent family history.  History   Social History  . Marital Status: Widowed    Spouse Name: N/A    Number of Children: N/A  . Years of Education: N/A   Occupational History  . Not on file.   Social History Main Topics  .  Smoking status: Never Smoker   . Smokeless tobacco: Not on file  . Alcohol Use: No  . Drug Use: No  . Sexually Active:  Review of Systems: Gen: Denies any fever, chills, sweats, anorexia, fatigue, weakness, malaise, weight loss, and sleep disorder CV: Denies chest pain, angina, palpitations, syncope, orthopnea, PND, peripheral edema, and claudication. Resp: Denies dyspnea at rest, dyspnea with exercise, cough, sputum, wheezing, coughing up blood, and pleurisy. GI: Denies vomiting blood or fecal incontinence.    GU : Denies urinary burning, blood in urine, urinary frequency, urinary hesitancy, nocturnal urination, and urinary incontinence. MS: Denies joint pain, limitation of movement, and swelling, stiffness, low back pain, extremity pain. Denies muscle weakness, cramps, atrophy.  Derm: Denies rash, itching, dry skin, hives, moles, warts, or unhealing ulcers.  Psych: Denies depression, anxiety, memory loss, suicidal ideation, hallucinations, paranoia, and confusion. Heme: Denies bruising, bleeding, and enlarged lymph nodes. Neuro:  Denies any headaches, dizziness, paresthesias. Endo:  Denies any problems with DM, thyroid, adrenal function.  Physical Exam: Vital signs in last 24 hours: Temp:  [97.3 F (36.3 C)-98 F (36.7 C)] 97.3 F (36.3 C) (10/28 1530) Pulse Rate:  [58-77] 63  (10/28 1530) Resp:  [9-58] 14  (10/28 1530) BP: (103-148)/(41-74) 122/61 mmHg (10/28 1530) SpO2:  [89 %-100 %] 98 % (10/28 1530) FiO2 (%):  [1.5 %] 1.5 % (10/28 1530) Weight:  [125 lb 14.1 oz (57.1 kg)] 125 lb 14.1 oz (57.1 kg) (10/28 1527) Last BM Date: 03/18/12 No LMP recorded. General:   Alert,  Well-developed, still somewhat sedated from procedure, pleasant and cooperative in NAD Head:  Normocephalic and atraumatic. Eyes:  Sclera clear, no icterus.   Conjunctiva pink. Ears:  Normal auditory acuity. Nose:  No deformity, discharge, or lesions. Mouth:  No deformity or lesions,oropharynx pink &  moist. Neck:  Supple; no masses or thyromegaly. Lungs:  Clear throughout to auscultation.   No wheezes, crackles, or rhonchi. No acute distress. Heart:  Regular rate and rhythm; no murmurs, clicks, rubs,  or gallops. Abdomen:  +dressings intact.  No drainage noted.  Decreased bowel sounds.  No bruits.  Soft, non-tender and non-distended without masses.  No guarding or rebound tenderness.   Rectal:  Deferred. Msk:  Symmetrical without gross deformities Pulses:  Normal pulses noted. Extremities:  No edema. Neurologic:  Alert and oriented x4;  grossly normal neurologically. Skin:  Intact without significant lesions or rashes. Lymph Nodes:  No significant cervical adenopathy. Psych:  Alert and cooperative. Normal mood and affect.  Intake/Output from previous day:   Intake/Output this shift: Total I/O In: 1000 [I.V.:1000] Out: -   Lab Results: No results found for this basename: WBC:3,HGB:3,HCT:3,PLT:3 in the last 72 hours BMET  [facility] 03/19/12 1334  NA 137  K 3.3*  CL 102  CO2 28  GLUCOSE 196*  BUN 16  CREATININE 0.56  CALCIUM 9.0   LFT Component     Latest Ref Rng 03/13/2012  Total Protein     6.0 - 8.3 g/dL 6.8  Albumin     3.5 - 5.2 g/dL 3.8  AST     0 - 37 U/L 190 (H)  ALT     0 - 35 U/L 263 (H)  Alkaline Phosphatase     39 - 117 U/L 358 (H)  Total Bilirubin     0.3 - 1.2 mg/dL 1.1  Bilirubin, Direct     0.0 - 0.3 mg/dL 0.7 (H)  Indirect Bilirubin     0.3 - 0.9 mg/dL 0.4   Basename 30/86/57 1334  PROT 6.1  ALBUMIN 3.3*  AST 151*  ALT 181*  ALKPHOS 300*  BILITOT 0.6  BILIDIR 0.3  IBILI 0.3  LIPASE --  AMYLASE --   Studies/Results: Dg Cholangiogram Operative  03/19/2012  *RADIOLOGY REPORT*  Clinical Data:   Cholelithiasis.  INTRAOPERATIVE CHOLANGIOGRAM  Comparison: Ultrasound 02/21/2012  Findings: There are multiple persistent filling defects within a dilated common bile duct concerning for retained stones.  Contrast does pass into the small  bowel.  Therefore, no complete obstruction.  IMPRESSION: Findings concerning for multiple retained common bile duct stones. Common bile duct is dilated.  Recommend ERCP for further evaluation.  These images were submitted for radiologic interpretation only. Please see the procedural report for the amount of contrast and the fluoroscopy time utilized.   Original Report Authenticated By: Cyndie Chime, M.D.     Impression: [name] is a pleasant 78 y.o. female with choledocholithiasis noted on IOC during laparoscopic cholecystectomy today.  She also had a transaminitis last week, LFTS mildly improved.  She will need ERCP with stone extraction. The risks, benefits, limitations, alternatives, and imponderables have been reviewed with the patient. I specifically discussed a 1 in 10 chance of pancreatitis, reaction to medications, bleeding, perforation and the possibility of a failed ERCP. Potential for sphincterotomy and stent placement also reviewed. Questions have been answered. All parties agreeable.  Pt is mildly sedated at this time so it will be reviewed tomorrow as well.   Plan: 1. Continue supportive measures now, pain control, antiemetics 2. ERCP Wednesday 3. Pt on Lovenox & will need to be stopped tomorrow for ERCP 4. PPI daily   LOS: 0 days   Lorenza Burton  03/19/2012, 4:10 PM [facility]   Pt seen and examined this evening; I have discussed the plan and reviewed todays IOC images with Dr. Lovell Sheehan, the patient and patients son.

## 2012-03-19 NOTE — Progress Notes (Addendum)
Dr. Lovell Sheehan returned page. New order received for telemetry monitoring. Order placed in patient's chart. Patietn complains of terribly sore throat. Could she have medication for this?

## 2012-03-20 DIAGNOSIS — K8050 Calculus of bile duct without cholangitis or cholecystitis without obstruction: Secondary | ICD-10-CM

## 2012-03-20 DIAGNOSIS — R7989 Other specified abnormal findings of blood chemistry: Secondary | ICD-10-CM

## 2012-03-20 LAB — CBC
HCT: 35.2 % — ABNORMAL LOW (ref 36.0–46.0)
Hemoglobin: 11.5 g/dL — ABNORMAL LOW (ref 12.0–15.0)
MCHC: 32.7 g/dL (ref 30.0–36.0)
MCV: 97 fL (ref 78.0–100.0)
WBC: 6.9 10*3/uL (ref 4.0–10.5)

## 2012-03-20 LAB — BASIC METABOLIC PANEL
BUN: 8 mg/dL (ref 6–23)
CO2: 30 mEq/L (ref 19–32)
Calcium: 8.8 mg/dL (ref 8.4–10.5)
Glucose, Bld: 138 mg/dL — ABNORMAL HIGH (ref 70–99)
Sodium: 140 mEq/L (ref 135–145)

## 2012-03-20 LAB — PHOSPHORUS: Phosphorus: 2.8 mg/dL (ref 2.3–4.6)

## 2012-03-20 NOTE — Progress Notes (Signed)
1 Day Post-Op  Subjective: Minimal pain. Feels much better.  Objective: Vital signs in last 24 hours: Temp:  [97.3 F (36.3 C)-98 F (36.7 C)] 97.5 F (36.4 C) (10/29 0533) Pulse Rate:  [58-67] 59  (10/29 0533) Resp:  [9-18] 16  (10/29 0533) BP: (103-151)/(41-68) 126/57 mmHg (10/29 0533) SpO2:  [97 %-100 %] 98 % (10/29 0747) Weight:  [57.1 kg (125 lb 14.1 oz)] 57.1 kg (125 lb 14.1 oz) (10/28 1527) Last BM Date: 03/18/12  Intake/Output from previous day: 10/28 0701 - 10/29 0700 In: 1650 [P.O.:480; I.V.:1160; IV Piggyback:10] Out: -  Intake/Output this shift:    General appearance: alert, cooperative and no distress Resp: clear to auscultation bilaterally Cardio: regular rate and rhythm, S1, S2 normal, no murmur, click, rub or gallop GI: Soft. Dressings dry and intact.  Lab Results:   [facility] 03/20/12 0445  WBC 6.9  HGB 11.5*  HCT 35.2*  PLT 187   BMET  Basename 03/20/12 0445 03/19/12 1334  NA 140 137  K 3.6 3.3*  CL 105 102  CO2 30 28  GLUCOSE 138* 196*  BUN 8 16  CREATININE 0.62 0.56  CALCIUM 8.8 9.0   PT/INR No results found for this basename: LABPROT:2,INR:2 in the last 72 hours  Studies/Results: Dg Cholangiogram Operative  03/19/2012  *RADIOLOGY REPORT*  Clinical Data:   Cholelithiasis.  INTRAOPERATIVE CHOLANGIOGRAM  Comparison: Ultrasound 02/21/2012  Findings: There are multiple persistent filling defects within a dilated common bile duct concerning for retained stones.  Contrast does pass into the small bowel.  Therefore, no complete obstruction.  IMPRESSION: Findings concerning for multiple retained common bile duct stones. Common bile duct is dilated.  Recommend ERCP for further evaluation.  These images were submitted for radiologic interpretation only. Please see the procedural report for the amount of contrast and the fluoroscopy time utilized.   Original Report Authenticated By: Cyndie Chime, M.D.     Anti-infectives: Anti-infectives     Start      Dose/Rate Route Frequency Ordered Stop   03/19/12 0859   vancomycin (VANCOCIN) IVPB 1000 mg/200 mL premix  Status:  Discontinued        1,000 mg 200 mL/hr over 60 Minutes Intravenous On call to O.R. 03/19/12 0859 03/19/12 1500          Assessment/Plan: s/p Procedure(s): LAPAROSCOPIC CHOLECYSTECTOMY WITH INTRAOPERATIVE CHOLANGIOGRAM Impression: Stable, status post laparoscopic cholecystectomy with common bile duct exploration. Her total bilirubin has returned to normal. She will undergo ERCP for stone extraction tomorrow.  LOS: 1 day    [name] A 03/20/2012

## 2012-03-20 NOTE — Addendum Note (Signed)
Addendum  created 03/20/12 1010 by Despina Hidden, CRNA   Modules edited:Notes Section

## 2012-03-20 NOTE — Progress Notes (Signed)
Respiratory placed humidity bottle on patient oxygen

## 2012-03-20 NOTE — Progress Notes (Signed)
REVIEWED.  

## 2012-03-20 NOTE — Progress Notes (Signed)
Subjective: Pt sitting up in bed eating breakfast.  Tolerating diet well.  Denies abdominal pain, "just a bit sore".  Denies N/V.  No BM yet.   Objective: Vital signs in last 24 hours: Temp:  [97.3 F (36.3 C)-98 F (36.7 C)] 97.5 F (36.4 C) (10/29 0533) Pulse Rate:  [58-77] 59  (10/29 0533) Resp:  [9-58] 16  (10/29 0533) BP: (103-151)/(41-74) 126/57 mmHg (10/29 0533) SpO2:  [89 %-100 %] 98 % (10/29 0747) Weight:  [125 lb 14.1 oz (57.1 kg)] 125 lb 14.1 oz (57.1 kg) (10/28 1527) Last BM Date: 03/18/12 No LMP recorded. Body mass index is 21.61 kg/(m^2). General:   Alert, pleasant and cooperative in NAD Eyes:  Sclera clear, no icterus.   Conjunctiva pink. Mouth:  No deformity or lesions, oropharynx pink & moist.. Heart:  Regular rate and rhythm Abdomen:   No significant drainage noted on intact dressings.  Normal bowel sounds.  Soft, nontender and nondistended. Extremities:  Without edema. Neurologic:  Alert and  oriented x4;  grossly normal neurologically. Skin:  Intact without significant lesions or rashes. Cervical Nodes:  No significant cervical adenopathy. Psych:  Alert and cooperative. Normal mood and affect.  Intake/Output from previous day: 10/28 0701 - 10/29 0700 In: 1650 [P.O.:480; I.V.:1160; IV Piggyback:10] Out: -   Lab Results:  [facility] 03/20/12 0445  WBC 6.9  HGB 11.5*  HCT 35.2*  PLT 187   BMET  Basename 03/20/12 0445 03/19/12 1334  NA 140 137  K 3.6 3.3*  CL 105 102  CO2 30 28  GLUCOSE 138* 196*  BUN 8 16  CREATININE 0.62 0.56  CALCIUM 8.8 9.0   LFT  Basename 03/19/12 1334  PROT 6.1  ALBUMIN 3.3*  AST 151*  ALT 181*  ALKPHOS 300*  BILITOT 0.6  BILIDIR 0.3  IBILI 0.3  LIPASE --  AMYLASE --   Studies/Results: Dg Cholangiogram Operative  03/19/2012  *RADIOLOGY REPORT*  Clinical Data:   Cholelithiasis.  INTRAOPERATIVE CHOLANGIOGRAM  Comparison: Ultrasound 02/21/2012  Findings: There are multiple persistent filling defects within a  dilated common bile duct concerning for retained stones.  Contrast does pass into the small bowel.  Therefore, no complete obstruction.  IMPRESSION: Findings concerning for multiple retained common bile duct stones. Common bile duct is dilated.  Recommend ERCP for further evaluation.  These images were submitted for radiologic interpretation only. Please see the procedural report for the amount of contrast and the fluoroscopy time utilized.   Original Report Authenticated By: Cyndie Chime, M.D.     Assessment: 1. Choledocholithiasis:  s/p lap cholecystectomy yesterday.  ERCP tomorrow. 2. Transaminitis:  Suspect secondary to #1 3. Abdominal pain:  Resolved currently  Plan: 1. ERCP with stone extraction & sphincterotomy tomorrow with Dr Jena Gauss 2. Follow LFTs to baseline after ERCP 3. Hold Lovenox today for procedure tomorrow  LOS: 1 day   Lorenza Burton  03/20/2012, 8:02 AM

## 2012-03-20 NOTE — Anesthesia Postprocedure Evaluation (Signed)
  Anesthesia Post-op Note  Patient: [name]  Procedure(s) Performed: Procedure(s) (LRB) with comments: LAPAROSCOPIC CHOLECYSTECTOMY WITH INTRAOPERATIVE CHOLANGIOGRAM (N/A) - with common duct exploration  Patient Location: room 306  Anesthesia Type:General  Level of Consciousness: awake, alert , oriented and patient cooperative  Airway and Oxygen Therapy: Patient Spontanous Breathing  Post-op Pain: none  Post-op Assessment: Post-op Vital signs reviewed, Patient's Cardiovascular Status Stable, Respiratory Function Stable, Patent Airway and Pain level controlled  Post-op Vital Signs: Reviewed and stable  Complications: No apparent anesthesia complications

## 2012-03-20 NOTE — Progress Notes (Signed)
INITIAL ADULT NUTRITION ASSESSMENT Date: 03/20/2012   Time: 2:33 PM Reason for Assessment: Malnutrition Screen  ASSESSMENT: Female 78 y.o.  Dx: Cholecystitis, cholelithiasis   Past Medical History  Diagnosis Date  . Hypertension   . Irregular heart beat   . Depression   . Dupuytren's contracture of both hands     Scheduled Meds:   . acetaminophen  1,000 mg Intravenous Q6H  . diltiazem  240 mg Oral Daily  . lisinopril  40 mg Oral Daily  . pantoprazole (PROTONIX) IV  40 mg Intravenous Q24H  . PARoxetine  10 mg Oral Daily  . DISCONTD: chlorhexidine  1 application Topical Once  . DISCONTD: enoxaparin  40 mg Subcutaneous Q24H  . DISCONTD: vancomycin  1,000 mg Intravenous On Call to OR   Continuous Infusions:   . dextrose 5 % and 0.45 % NaCl with KCl 40 mEq/L 75 mL/hr at 03/20/12 0550  . DISCONTD: lactated ringers 75 mL/hr at 03/19/12 1009   PRN Meds:.HYDROcodone-acetaminophen, morphine injection, ondansetron (ZOFRAN) IV, ondansetron, DISCONTD: fentaNYL, DISCONTD: midazolam, DISCONTD: ondansetron (ZOFRAN) IV  Ht: 5\' 4"  (162.6 cm)  Wt: 125 lb 14.1 oz (57.1 kg)  Ideal Wt: 54.7 kg  % Ideal Wt:105%  Usual Wt: 140# (6 months ago per pt) % Usual Wt: 90%   Body mass index is 21.61 kg/(m^2). Normal Range  Food/Nutrition Related Hx: Pt has hx of wt loss 14#, 10%x 6 months which is significant. Pt reports wt loss r/t nausea and unable to eat usual amount of food. S/p cholecystectomy  She tolerated oatmeal and coffee this morning but ate very little at lunch due to late breakfast. Pt optimistic that she will now be able to resume regular meal intake once procedures are completed.Declines oral supplement at this time. Feeds herself and denies chew/swallow difficulty. She will be NPO after MN for ERCP tomorrow.   Pt meets criteria for non-severe malnutrition in the context of chronic illness as evidenced by <75% energy intake for >/= 1 month and 14#, 10% wt loss in 6  months.   CMP     Component Value Date/Time   NA 140 03/20/2012 0445   K 3.6 03/20/2012 0445   CL 105 03/20/2012 0445   CO2 30 03/20/2012 0445   GLUCOSE 138* 03/20/2012 0445   BUN 8 03/20/2012 0445   CREATININE 0.62 03/20/2012 0445   CALCIUM 8.8 03/20/2012 0445   PROT 6.1 03/19/2012 1334   ALBUMIN 3.3* 03/19/2012 1334   AST 151* 03/19/2012 1334   ALT 181* 03/19/2012 1334   ALKPHOS 300* 03/19/2012 1334   BILITOT 0.6 03/19/2012 1334   GFRNONAA 81* 03/20/2012 0445   GFRAA >90 03/20/2012 0445    Intake/Output Summary (Last 24 hours) at 03/20/12 1443 Last data filed at 03/19/12 1841  Gross per 24 hour  Intake    650 ml  Output      0 ml  Net    650 ml     Diet Order: General Regular diet  Supplements/Tube Feeding:none at this time  IVF:    dextrose 5 % and 0.45 % NaCl with KCl 40 mEq/L Last Rate: 75 mL/hr at 03/20/12 0550  DISCONTD: lactated ringers Last Rate: 75 mL/hr at 03/19/12 1009    Estimated Nutritional Needs:   Kcal:1425-1710 kcal Protein:63-74 gr Fluid:1 ml/kcal  NUTRITION DIAGNOSIS: -Inadequate oral intake (NI-2.1).  Status: Ongoing  RELATED TO: decreased ability to eat  AS EVIDENCE BY: hx of ongoing nausea, and significant wt loss  MONITORING/EVALUATION(Goals): Monitor- diet advancement and  tolerance, po intake and wt changes Goal: Pt to meet >/= 90% of their estimated nutrition needs; not met  EDUCATION NEEDS: -No education needs identified at this time  INTERVENTION: -Nutrition Services will obtain food preferences each meal to optimize oral intake -Offer snacks and hydration between meals    Dietitian [phone]  DOCUMENTATION CODES Per approved criteria  -Non-severe (moderate) malnutrition in the context of chronic illness    Francene Boyers 03/20/2012, 2:33 PM

## 2012-03-21 ENCOUNTER — Encounter (HOSPITAL_COMMUNITY): Payer: Self-pay | Admitting: *Deleted

## 2012-03-21 ENCOUNTER — Encounter (HOSPITAL_COMMUNITY): Payer: Self-pay | Admitting: Anesthesiology

## 2012-03-21 ENCOUNTER — Encounter (HOSPITAL_COMMUNITY)
Admission: RE | Disposition: A | Discharge status: Home Health | Payer: Self-pay | Source: Ambulatory Visit | Attending: General Surgery

## 2012-03-21 ENCOUNTER — Inpatient Hospital Stay (HOSPITAL_COMMUNITY): Payer: Medicare Other | Admitting: Anesthesiology

## 2012-03-21 ENCOUNTER — Inpatient Hospital Stay (HOSPITAL_COMMUNITY): Payer: Medicare Other

## 2012-03-21 DIAGNOSIS — K8050 Calculus of bile duct without cholangitis or cholecystitis without obstruction: Secondary | ICD-10-CM

## 2012-03-21 HISTORY — PX: REMOVAL OF STONES: SHX5545

## 2012-03-21 HISTORY — PX: BALLOON DILATION: SHX5330

## 2012-03-21 HISTORY — PX: SPHINCTEROTOMY: SHX5544

## 2012-03-21 HISTORY — PX: ERCP: SHX5425

## 2012-03-21 LAB — GLUCOSE, CAPILLARY: Glucose-Capillary: 116 mg/dL — ABNORMAL HIGH (ref 70–99)

## 2012-03-21 SURGERY — ERCP, WITH INTERVENTION IF INDICATED
Anesthesia: General

## 2012-03-21 MED ORDER — STERILE WATER FOR IRRIGATION IR SOLN
Status: DC | PRN
  Administered 2012-03-21: 10:00:00

## 2012-03-21 MED ORDER — FENTANYL CITRATE 0.05 MG/ML IJ SOLN: 25.0000 ug | INTRAMUSCULAR | Status: DC | PRN

## 2012-03-21 MED ORDER — PROPOFOL 10 MG/ML IV BOLUS
INTRAVENOUS | Status: DC | PRN
  Administered 2012-03-21: 80 mg via INTRAVENOUS

## 2012-03-21 MED ORDER — LACTATED RINGERS IV SOLN
INTRAVENOUS | Status: DC
  Administered 2012-03-21: 09:00:00 via INTRAVENOUS

## 2012-03-21 MED ORDER — ONDANSETRON HCL 4 MG/2ML IJ SOLN
INTRAMUSCULAR | Status: AC
  Filled 2012-03-21: qty 2

## 2012-03-21 MED ORDER — METOPROLOL TARTRATE 1 MG/ML IV SOLN
INTRAVENOUS | Status: AC
  Filled 2012-03-21: qty 5

## 2012-03-21 MED ORDER — LIDOCAINE HCL (CARDIAC) 20 MG/ML IV SOLN
INTRAVENOUS | Status: DC | PRN
  Administered 2012-03-21: 40 mg via INTRAVENOUS

## 2012-03-21 MED ORDER — LIDOCAINE HCL (PF) 1 % IJ SOLN
INTRAMUSCULAR | Status: AC
  Filled 2012-03-21: qty 5

## 2012-03-21 MED ORDER — LACTATED RINGERS IV SOLN
INTRAVENOUS | Status: DC | PRN
  Administered 2012-03-21 (×2): via INTRAVENOUS

## 2012-03-21 MED ORDER — FENTANYL CITRATE 0.05 MG/ML IJ SOLN
INTRAMUSCULAR | Status: DC | PRN
  Administered 2012-03-21 (×2): 50 ug via INTRAVENOUS

## 2012-03-21 MED ORDER — ONDANSETRON HCL 4 MG/2ML IJ SOLN
4.0000 mg | Freq: Once | INTRAMUSCULAR | Status: AC
  Administered 2012-03-21: 4 mg via INTRAVENOUS

## 2012-03-21 MED ORDER — PROPOFOL 10 MG/ML IV EMUL
INTRAVENOUS | Status: AC
  Filled 2012-03-21: qty 20

## 2012-03-21 MED ORDER — SODIUM CHLORIDE 0.9 % IV SOLN: INTRAVENOUS | Status: DC

## 2012-03-21 MED ORDER — GLYCOPYRROLATE 0.2 MG/ML IJ SOLN
INTRAMUSCULAR | Status: AC
  Filled 2012-03-21: qty 1

## 2012-03-21 MED ORDER — GLYCOPYRROLATE 0.2 MG/ML IJ SOLN
INTRAMUSCULAR | Status: DC | PRN
  Administered 2012-03-21: .4 mg via INTRAVENOUS

## 2012-03-21 MED ORDER — FENTANYL CITRATE 0.05 MG/ML IJ SOLN
INTRAMUSCULAR | Status: AC
  Filled 2012-03-21: qty 2

## 2012-03-21 MED ORDER — ROCURONIUM BROMIDE 50 MG/5ML IV SOLN
INTRAVENOUS | Status: AC
  Filled 2012-03-21: qty 1

## 2012-03-21 MED ORDER — ONDANSETRON HCL 4 MG/2ML IJ SOLN: 4.0000 mg | Freq: Once | INTRAMUSCULAR | Status: DC | PRN

## 2012-03-21 MED ORDER — ROCURONIUM BROMIDE 100 MG/10ML IV SOLN
INTRAVENOUS | Status: DC | PRN
  Administered 2012-03-21: 30 mg via INTRAVENOUS

## 2012-03-21 MED ORDER — IOHEXOL 350 MG/ML SOLN
INTRAVENOUS | Status: DC | PRN
  Administered 2012-03-21: 10:00:00

## 2012-03-21 MED ORDER — SODIUM CHLORIDE 0.9 % IJ SOLN
INTRAMUSCULAR | Status: AC
  Administered 2012-03-21: 17:00:00
  Filled 2012-03-21: qty 3

## 2012-03-21 MED ORDER — GLYCOPYRROLATE 0.2 MG/ML IJ SOLN
0.2000 mg | Freq: Once | INTRAMUSCULAR | Status: AC
  Administered 2012-03-21: 0.2 mg via INTRAVENOUS

## 2012-03-21 MED ORDER — METOPROLOL TARTRATE 1 MG/ML IV SOLN
5.0000 mg | Freq: Once | INTRAVENOUS | Status: AC
  Administered 2012-03-21: 5 mg via INTRAVENOUS

## 2012-03-21 MED ORDER — MIDAZOLAM HCL 2 MG/2ML IJ SOLN
1.0000 mg | INTRAMUSCULAR | Status: DC | PRN
  Administered 2012-03-21: 2 mg via INTRAVENOUS

## 2012-03-21 MED ORDER — MIDAZOLAM HCL 2 MG/2ML IJ SOLN
INTRAMUSCULAR | Status: AC
  Filled 2012-03-21: qty 2

## 2012-03-21 MED ORDER — NEOSTIGMINE METHYLSULFATE 1 MG/ML IJ SOLN
INTRAMUSCULAR | Status: DC | PRN
  Administered 2012-03-21: 3 mg via INTRAVENOUS

## 2012-03-21 MED FILL — Bupivacaine HCl Inj 0.5%: INTRAMUSCULAR | Qty: 30 | Status: AC

## 2012-03-21 SURGICAL SUPPLY — 29 items
BALLN RETRIEVAL 12X15 (BALLOONS) IMPLANT
BALN RTRVL 200 6-7FR 12-15 (BALLOONS)
BASKET TRAPEZOID 3X6 (MISCELLANEOUS) IMPLANT
BASKET TRAPEZOID LITHO 2.5X5 (MISCELLANEOUS) IMPLANT
BLOCK BITE 60FR ADLT L/F BLUE (MISCELLANEOUS) ×1 IMPLANT
BSKT STON RTRVL TRAPEZOID 3X6 (MISCELLANEOUS)
CRE WIREGUIDED BALLOON DILATATION CATHETER 8 MM 9MM 10MM ×1 IMPLANT
DEVICE INFLATION ENCORE 26 (MISCELLANEOUS) IMPLANT
DEVICE LOCKING W-BIOPSY CAP (MISCELLANEOUS) ×2 IMPLANT
EXTRACTOR PRO RX 12MM/15MM ×1 IMPLANT
FLOOR PAD 36X40 (MISCELLANEOUS) ×2
GUIDEWIRE HYDRA JAGWIRE .35 (WIRE) IMPLANT
GUIDEWIRE JAG HINI 025X260CM (WIRE) IMPLANT
NDL HYPO 18GX1.5 BLUNT FILL (NEEDLE) IMPLANT
NEEDLE HYPO 18GX1.5 BLUNT FILL (NEEDLE) ×2 IMPLANT
PAD FLOOR 36X40 (MISCELLANEOUS) IMPLANT
SNARE ROTATE MED OVAL 20MM (MISCELLANEOUS) IMPLANT
SNARE SHORT THROW 27M MED OVAL (MISCELLANEOUS) IMPLANT
SPHINCTEROTOME AUTOTOME .25 (MISCELLANEOUS) IMPLANT
SPHINCTEROTOME HYDRATOME 44 (MISCELLANEOUS) ×2 IMPLANT
SPONGE GAUZE 4X4 12PLY (GAUZE/BANDAGES/DRESSINGS) ×1 IMPLANT
SYR 20CC LL (SYRINGE) ×1 IMPLANT
SYR 3ML LL SCALE MARK (SYRINGE) IMPLANT
SYR 50ML LL SCALE MARK (SYRINGE) ×1 IMPLANT
SYSTEM CONTINUOUS INJECTION (MISCELLANEOUS) ×2 IMPLANT
TUBING ENDO SMARTCAP PENTAX (MISCELLANEOUS) ×1 IMPLANT
WALLSTENT METAL COVERED 10X60 (STENTS) IMPLANT
WALLSTENT METAL COVERED 10X80 (STENTS) IMPLANT
WATER STERILE IRR 1000ML POUR (IV SOLUTION) ×2 IMPLANT

## 2012-03-21 NOTE — Op Note (Signed)
[name], [name]               ACCOUNT NO.:  [id number]  MEDICAL RECORD NO.:  [id number]  LOCATION:  [facility]                          FACILITY:  [facility]  PHYSICIAN:  R. Roetta Sessions, MD FACP FACGDATE OF BIRTH:  03/22/1926  DATE OF PROCEDURE:  03/21/2012 DATE OF DISCHARGE:                              OPERATIVE REPORT   PROCEDURE:  ERCP with endoscopic biliary sphincterotomy followed by sphincterotomy balloon dilation followed by balloon stone extraction.  INDICATIONS FOR PROCEDURE:  An 78 year old lady admitted to the hospital with cholecystitis recently status post cholecystectomy laparoscopically two days ago by Dr. Lovell Sheehan.  Liver enzymes are mildly elevated. Intraoperative cholangiogram demonstrated multiple stones and a dilated distal common bile duct.  We were consulted.  ERCP is now being done to clear her biliary tree.  Risks, benefits, limitations, alternatives, imponderables have been reviewed with the patient and the patient's son on at least 2 occasions.  All parties agreeable.  Please see the documentation medical record.  The patient had run of ventricular tachycardia while under general anesthesia at the time of cholecystectomy.  Thus far, her cardiac rhythm has been good.  She has been evaluated closely by Dr. Marcos Eke and associates.  DESCRIPTION OF PROCEDURE:  General endotracheal anesthesia was induced by Dr. Marcos Eke and associates.  She was placed in the semiprone position on the left side.  Instrument Pentax video chip side-viewing duodenum scope.  FINDINGS:  Cursory examination of the distal esophagus, stomach, through the second portion of duodenum revealed no abnormalities.  The scope was pulled back to the short position, 55 cm from the incisors with ampulla of Vater was readily identified on the medial wall.  Scout film was taken.  Using the Microvasive sphincter tone, the ampulla was approached using guidewire palpation.  I almost immediately achieved  a deep biliary cannulation.  The sphincterotone followed up.  Cholangiogram was performed.  The common hepatic and common bile duct were dilated without stricture being seen.  There at least 2 distal common bile duct filling defects consistent with stones.  The sphincterotome was pulled back across the ampullary orifice with the safety wire remaining deep in the biliary tree, approximately 8-9 mm sphincterotomy was performed at 12 o'clock position without apparent complication.  I elected to go ahead and dilate the sphincterotomy with the dilating balloon to facilitate stone extraction.  I placed the dilating balloon across the sphincterotomy within stone guidewire under fluoroscopic control.  A slowly inflated balloon to 8-9 and then 10 mm and held there for 1 minute and took it down.  This achieved additional widening of the sphincterotomy without bleeding or other apparent complication. Subsequently, I railed the balloon off and railed on an extraction balloon.  It was railed to the confluence and inflated to accommodate the size of the duct at this level.  Subsequent balloon occlusion cholangiogram was performed with recovery of (2)nearly 1 cm  gallstones and quite a bit of gravel.  This maneuver was repeated 2 more times and no more stones or debris were recovered.  Multiple air bubbles were introduced into the biliary tree.  There appeared to be excellent drainage of the biliary tree following this maneuver.  The patient  tolerated the procedure well.  No arrhythmias noted during the procedure, she was taken to PACU in stable condition.  IMPRESSION:  Dilated biliary tree status post cholecystectomy, status post biliary sphincterotomy, balloon dilation as described above followed by balloon stone extraction.  The pancreatic duct was not injected or manipulated in any way.  RECOMMENDATIONS: 1. Clear liquid diet. 2. Continued post anesthesia care. 3. Eventual hospital discharge  per Dr. Lovell Sheehan.  Findings and recommendations have been discussed with multiple family members following the procedure.     Jonathon Bellows, MD Caleen Essex     RMR/MEDQ  D:  03/21/2012  T:  03/21/2012  Job:  [id number]

## 2012-03-21 NOTE — Progress Notes (Signed)
Day of Surgery  Subjective: Feels fine. No significant abdominal pain noted. Tolerating clear liquid diet.  Objective: Vital signs in last 24 hours: Temp:  [97.3 F (36.3 C)-98.7 F (37.1 C)] 97.7 F (36.5 C) (10/30 1446) Pulse Rate:  [59-79] 71  (10/30 1446) Resp:  [14-24] 16  (10/30 1446) BP: (109-163)/(55-85) 117/55 mmHg (10/30 1446) SpO2:  [90 %-100 %] 96 % (10/30 1446) Last BM Date: 03/18/12  Intake/Output from previous day: 10/29 0701 - 10/30 0700 In: 2077 [P.O.:240; I.V.:1825; IV Piggyback:12] Out: 200 [Urine:200] Intake/Output this shift: Total I/O In: 1150 [I.V.:1150] Out: 300 [Urine:300]  General appearance: alert and cooperative Resp: clear to auscultation bilaterally Cardio: regular rate and rhythm, S1, S2 normal, no murmur, click, rub or gallop GI: Soft. Incisions healing well. No rigidity noted. No distention noted.  Lab Results:   [facility] 03/20/12 0445  WBC 6.9  HGB 11.5*  HCT 35.2*  PLT 187   BMET  Basename 03/20/12 0445 03/19/12 1334  NA 140 137  K 3.6 3.3*  CL 105 102  CO2 30 28  GLUCOSE 138* 196*  BUN 8 16  CREATININE 0.62 0.56  CALCIUM 8.8 9.0   PT/INR No results found for this basename: LABPROT:2,INR:2 in the last 72 hours  Studies/Results: Dg Ercp With Sphincterotomy  03/21/2012  *RADIOLOGY REPORT*  Clinical Data: Choledocholithiasis  ERCP with balloon sphincterotomy and balloon assisted stone extraction  Comparison:  Intraoperative cholangiogram 03/19/2012  Technique:  Multiple spot images obtained with the fluoroscopic device and submitted for interpretation post-procedure.  ERCP was performed by Dr. Eula Listen. Reported fluoroscopic time 3 minutes 25 seconds.  Findings: Spot fluoroscopic images obtained at the time of ERCP are submitted for radiologic interpretation.  The images demonstrate an endoscope in the expected location of the duodenum followed by catheterization and retrograde opacification of the common bile duct.  The  common bile duct is dilated and there are multiple filling defects consistent with choledocholithiasis. The final images demonstrate balloon sweeping of the common bile duct.  IMPRESSION:  ERCP, sphincterotomy and balloon assisted CBD stone extraction as detailed above.  These images were submitted for radiologic interpretation only. Please see the procedural report for the amount of contrast and the fluoroscopy time utilized.   Original Report Authenticated By: Sterling Big, M.D.     Anti-infectives: Anti-infectives     Start     Dose/Rate Route Frequency Ordered Stop   03/19/12 0859   vancomycin (VANCOCIN) IVPB 1000 mg/200 mL premix  Status:  Discontinued        1,000 mg 200 mL/hr over 60 Minutes Intravenous On call to O.R. 03/19/12 0859 03/19/12 1500          Assessment/Plan: s/p Procedure(s): ENDOSCOPIC RETROGRADE CHOLANGIOPANCREATOGRAPHY (ERCP) SPHINCTEROTOMY BALLOON DILATION Impression: Doing well, status post ERCP with stone extraction. Plan: Will monitor for the next 24 hours. Will advance her diet as tolerated. Anticipate discharge tomorrow.  LOS: 2 days    Janelis Stelzer A 03/21/2012

## 2012-03-21 NOTE — Plan of Care (Signed)
Problem: Phase I Progression Outcomes Goal: Incision/dressings dry and intact Outcome: Not Applicable Date Met:  03/21/12 Dressings removed in OR today.

## 2012-03-21 NOTE — Transfer of Care (Signed)
Immediate Anesthesia Transfer of Care Note  Patient: [name]  Procedure(s) Performed: Procedure(s) (LRB) with comments: ENDOSCOPIC RETROGRADE CHOLANGIOPANCREATOGRAPHY (ERCP) (N/A) SPHINCTEROTOMY (N/A) BALLOON DILATION (N/A) - Balloon Stone Extraction  Patient Location: PACU  Anesthesia Type:General  Level of Consciousness: awake, alert  and oriented  Airway & Oxygen Therapy: Patient Spontanous Breathing and Patient connected to nasal cannula oxygen  Post-op Assessment: Report given to PACU RN  Post vital signs: Reviewed and stable  Complications: No apparent anesthesia complications

## 2012-03-21 NOTE — Anesthesia Preprocedure Evaluation (Signed)
Anesthesia Evaluation  Patient identified by MRN, date of birth, ID band Patient awake    Reviewed: Allergy & Precautions, H&P , NPO status , Patient's Chart, lab work & pertinent test results  History of Anesthesia Complications Negative for: history of anesthetic complications  Airway Mallampati: II      Dental  (+) Teeth Intact   Pulmonary neg pulmonary ROS,  breath sounds clear to auscultation        Cardiovascular hypertension, Pt. on medications + dysrhythmias (hx irregular HR, had short run VT during lap chole surgery. resolved spontaneously.) Rhythm:Regular Rate:Normal     Neuro/Psych PSYCHIATRIC DISORDERS Depression    GI/Hepatic   Endo/Other  diabetes (borderline)  Renal/GU      Musculoskeletal   Abdominal   Peds  Hematology   Anesthesia Other Findings   Reproductive/Obstetrics                           Anesthesia Physical Anesthesia Plan  ASA: III  Anesthesia Plan: General   Post-op Pain Management:    Induction: Intravenous  Airway Management Planned: Oral ETT  Additional Equipment:   Intra-op Plan:   Post-operative Plan: Extubation in OR  Informed Consent: I have reviewed the patients History and Physical, chart, labs and discussed the procedure including the risks, benefits and alternatives for the proposed anesthesia with the patient or authorized representative who has indicated his/her understanding and acceptance.     Plan Discussed with: CRNA  Anesthesia Plan Comments:         Anesthesia Quick Evaluation

## 2012-03-21 NOTE — Progress Notes (Signed)
Defibrillation pads were placed on pt while in the OR for precautionary measures. Removed prior to discharge from the pacu, no skin irriation noted.

## 2012-03-21 NOTE — Anesthesia Postprocedure Evaluation (Signed)
  Anesthesia Post-op Note  Patient: [name]  Procedure(s) Performed: Procedure(s) (LRB) with comments: ENDOSCOPIC RETROGRADE CHOLANGIOPANCREATOGRAPHY (ERCP) (N/A) SPHINCTEROTOMY (N/A) BALLOON DILATION (N/A) - Balloon Stone Extraction  Patient Location: PACU  Anesthesia Type:General  Level of Consciousness: awake, alert  and oriented  Airway and Oxygen Therapy: Patient Spontanous Breathing and Patient connected to nasal cannula oxygen  Post-op Pain: none  Post-op Assessment: Post-op Vital signs reviewed, Patient's Cardiovascular Status Stable, Respiratory Function Stable, Patent Airway, No signs of Nausea or vomiting and Pain level controlled  Post-op Vital Signs: Reviewed and stable  Complications: No apparent anesthesia complications

## 2012-03-21 NOTE — Preoperative (Signed)
Beta Blockers   Reason not to administer Beta Blockers:Not Applicable 

## 2012-03-22 ENCOUNTER — Encounter (HOSPITAL_COMMUNITY): Payer: Self-pay | Admitting: General Surgery

## 2012-03-22 DIAGNOSIS — R7989 Other specified abnormal findings of blood chemistry: Secondary | ICD-10-CM

## 2012-03-22 DIAGNOSIS — K8050 Calculus of bile duct without cholangitis or cholecystitis without obstruction: Secondary | ICD-10-CM

## 2012-03-22 LAB — HEPATIC FUNCTION PANEL
Albumin: 2.6 g/dL — ABNORMAL LOW (ref 3.5–5.2)
Indirect Bilirubin: 0.4 mg/dL (ref 0.3–0.9)
Total Protein: 5.2 g/dL — ABNORMAL LOW (ref 6.0–8.3)

## 2012-03-22 NOTE — Discharge Summary (Signed)
Physician Discharge Summary  Patient ID: [name] MRN: [id number] DOB/AGE: 1925-08-31 78 y.o.  Admit date: 03/19/2012 Discharge date: 03/22/2012  Admission Diagnoses: Cholecystitis, cholelithiasis, choledocholithiasis  Discharge Diagnoses: Same Active Problems:  * No active hospital problems. *    Discharged Condition: good  Hospital Course: Patient is an 78 year old white female who underwent a laparoscopic cholecystectomy with common bile duct exploration on 03/19/2012. The common bile duct stones could not be removed. Dr. report of gastroenterology was consulted and he subsequently did an ERCP with stone extraction on 03/21/2012. She tolerated both procedures well. She has had only occasional PVCs. Her postoperative course has been unremarkable. Her diet was advanced at difficulty. Her liver enzyme tests have improved significantly. Her total bilirubin is normal. She is being discharged home on 1031 in good improving condition.  Consults: GI  Treatments: surgery: Laparoscopic cholecystectomy with common bile duct exploration on 03/19/2012  ERCP with stone extraction on 03/21/2012  Discharge Exam: Blood pressure 92/50, pulse 73, temperature 97.9 F (36.6 C), temperature source Oral, resp. rate 20, height 5\' 4"  (1.626 m), weight 57.1 kg (125 lb 14.1 oz), SpO2 95.00%. General appearance: alert, cooperative and no distress Resp: clear to auscultation bilaterally Cardio: regular rate and rhythm, S1, S2 normal, no murmur, click, rub or gallop GI: Soft, nontender, nondistended. Incisions healing well.  Disposition: 06-Home-Health Care Svc     Medication List     As of 03/22/2012  8:46 AM    TAKE these medications         aspirin EC 81 MG tablet   Take 81 mg by mouth daily.      diltiazem 240 MG 24 hr capsule   Commonly known as: CARDIZEM CD   Take 1 capsule by mouth Daily.      lisinopril 40 MG tablet   Commonly known as: PRINIVIL,ZESTRIL   Take 40 mg by mouth  daily.      PARoxetine 20 MG tablet   Commonly known as: PAXIL   Take 10 mg by mouth daily.      sodium chloride 0.65 % nasal spray   Commonly known as: OCEAN   Place 1 spray into the nose as needed. For nasal dryness      SYSTANE 0.4-0.3 % Soln   Generic drug: Polyethyl Glycol-Propyl Glycol   Place 1 drop into both eyes daily as needed. For dry eyes           Follow-up Information    Follow up with Dalia Heading, MD. Schedule an appointment as soon as possible for a visit on 03/29/2012.   Contact information:   [address] [place] [address] [phone]          Signed: Franky Macho A 03/22/2012, 8:46 AM

## 2012-03-22 NOTE — Progress Notes (Signed)
Pt's BP 91/49.  Heart rate 61-62 on monitor.  Notified Dr. Lovell Sheehan.  He stated to hold her blood pressure medication today and for her to follow-up with Dr. Ouida Sills within 1-2 weeks.  He stated to tell the patient to not take her blood pressure medication at home if she feels dizzy.  Orders followed.

## 2012-03-22 NOTE — Progress Notes (Signed)
Pt's IV removed.  Pt tolerated well.  Pt and pt's daughter provided d/c instructions.  Pt and pt's daughter verbalized understanding of d/c instructions and demonstrated teach back.  Pt stable at time of d/c.

## 2012-03-22 NOTE — Progress Notes (Signed)
Subjective:  Feels better. No vomiting or abdominal pain. Going home today.  Objective: Vital signs in last 24 hours: Temp:  [97.3 F (36.3 C)-98.7 F (37.1 C)] 97.9 F (36.6 C) (10/31 0520) Pulse Rate:  [62-79] 73  (10/31 0520) Resp:  [14-24] 20  (10/31 0520) BP: (92-159)/(50-85) 92/50 mmHg (10/31 0520) SpO2:  [90 %-100 %] 95 % (10/31 0520) Last BM Date: 03/18/12 General:   Alert,  Well-developed, well-nourished, pleasant and cooperative in NAD Head:  Normocephalic and atraumatic. Eyes:  Sclera clear, no icterus.   Abdomen:  Soft, nontender and nondistended. Normal bowel sounds, without guarding, and without rebound.   Extremities:  Without clubbing, deformity or edema. Neurologic:  Alert and  oriented x4;  grossly normal neurologically. Skin:  Intact without significant lesions or rashes. Psych:  Alert and cooperative. Normal mood and affect.  Intake/Output from previous day: 10/30 0701 - 10/31 0700 In: 1630 [P.O.:480; I.V.:1150] Out: 900 [Urine:900] Intake/Output this shift:    Lab Results: CBC  Basename 03/20/12 0445  WBC 6.9  HGB 11.5*  HCT 35.2*  MCV 97.0  PLT 187   BMET  Basename 03/20/12 0445 03/19/12 1334  NA 140 137  K 3.6 3.3*  CL 105 102  CO2 30 28  GLUCOSE 138* 196*  BUN 8 16  CREATININE 0.62 0.56  CALCIUM 8.8 9.0   LFTs  Basename 03/22/12 0509 03/19/12 1334  BILITOT 0.5 0.6  BILIDIR 0.1 0.3  IBILI 0.4 0.3  ALKPHOS 216* 300*  AST 28 151*  ALT 73* 181*  PROT 5.2* 6.1  ALBUMIN 2.6* 3.3*    Imaging Studies:   Dg Ercp With Sphincterotomy  03/21/2012  *RADIOLOGY REPORT*  Clinical Data: Choledocholithiasis  ERCP with balloon sphincterotomy and balloon assisted stone extraction  Comparison:  Intraoperative cholangiogram 03/19/2012  Technique:  Multiple spot images obtained with the fluoroscopic device and submitted for interpretation post-procedure.  ERCP was performed by Dr. Eula Listen. Reported fluoroscopic time 3 minutes 25 seconds.   Findings: Spot fluoroscopic images obtained at the time of ERCP are submitted for radiologic interpretation.  The images demonstrate an endoscope in the expected location of the duodenum followed by catheterization and retrograde opacification of the common bile duct.  The common bile duct is dilated and there are multiple filling defects consistent with choledocholithiasis. The final images demonstrate balloon sweeping of the common bile duct.  IMPRESSION:  ERCP, sphincterotomy and balloon assisted CBD stone extraction as detailed above.  These images were submitted for radiologic interpretation only. Please see the procedural report for the amount of contrast and the fluoroscopy time utilized.   Original Report Authenticated By: Sterling Big, M.D.      Assessment: 78 y/o s/p ERCP with sphincterotomy and stone extraction yesterday for retained CBD stones. LFTs improved.  Plan: 1. Follow LFTs to normal as outpatient.   LOS: 3 days   Tana Coast  03/22/2012, 7:47 AM

## 2012-03-22 NOTE — Care Management Note (Signed)
    Page 1 of 1   03/22/2012     11:14:46 AM   CARE MANAGEMENT NOTE 03/22/2012  Patient:  [name], [name]   Account Number:  [id number]  Date Initiated:  03/22/2012  Documentation initiated by:  Sharrie Rothman  Subjective/Objective Assessment:   Pt admitted from home with s/p lap choley. Pt lives alone and will return home at discharge. Pt is independent with ADL's. Pt has 2 sons who are very active in the care of the pt.     Action/Plan:   No CM or HH needs noted.   Anticipated DC Date:  03/22/2012   Anticipated DC Plan:  HOME/SELF CARE      DC Planning Services  CM consult      Choice offered to / List presented to:             Status of service:  Completed, signed off Medicare Important Message given?  YES (If response is "NO", the following Medicare IM given date fields will be blank) Date Medicare IM given:  03/22/2012 Date Additional Medicare IM given:    Discharge Disposition:  HOME/SELF CARE  Per UR Regulation:    If discussed at Long Length of Stay Meetings, dates discussed:    Comments:  03/22/12 1112 Arlyss Queen, RN BSN CM

## 2012-03-23 ENCOUNTER — Encounter (HOSPITAL_COMMUNITY): Payer: Self-pay | Admitting: Internal Medicine

## 2012-03-31 NOTE — Progress Notes (Signed)
In reviewing the patient's chart, the patient had mild malnutrition secondary to gallbladder disease.

## 2013-01-06 ENCOUNTER — Encounter (HOSPITAL_COMMUNITY): Payer: Self-pay | Admitting: Internal Medicine

## 2013-10-03 ENCOUNTER — Emergency Department (HOSPITAL_COMMUNITY): Payer: Medicare Other

## 2013-10-03 ENCOUNTER — Encounter (HOSPITAL_COMMUNITY): Payer: Self-pay | Admitting: Emergency Medicine

## 2013-10-03 ENCOUNTER — Emergency Department (HOSPITAL_COMMUNITY)
Admission: EM | Admit: 2013-10-03 | Discharge: 2013-10-03 | Disposition: A | Discharge status: Home / Self Care | Payer: Medicare Other | Attending: Emergency Medicine | Admitting: Emergency Medicine

## 2013-10-03 DIAGNOSIS — R7309 Other abnormal glucose: Secondary | ICD-10-CM | POA: Insufficient documentation

## 2013-10-03 DIAGNOSIS — R748 Abnormal levels of other serum enzymes: Secondary | ICD-10-CM | POA: Insufficient documentation

## 2013-10-03 DIAGNOSIS — R739 Hyperglycemia, unspecified: Secondary | ICD-10-CM

## 2013-10-03 DIAGNOSIS — Z8739 Personal history of other diseases of the musculoskeletal system and connective tissue: Secondary | ICD-10-CM | POA: Insufficient documentation

## 2013-10-03 DIAGNOSIS — F329 Major depressive disorder, single episode, unspecified: Secondary | ICD-10-CM | POA: Insufficient documentation

## 2013-10-03 DIAGNOSIS — I10 Essential (primary) hypertension: Secondary | ICD-10-CM | POA: Insufficient documentation

## 2013-10-03 DIAGNOSIS — F3289 Other specified depressive episodes: Secondary | ICD-10-CM | POA: Insufficient documentation

## 2013-10-03 DIAGNOSIS — Z90710 Acquired absence of both cervix and uterus: Secondary | ICD-10-CM | POA: Insufficient documentation

## 2013-10-03 DIAGNOSIS — Z7982 Long term (current) use of aspirin: Secondary | ICD-10-CM | POA: Insufficient documentation

## 2013-10-03 DIAGNOSIS — Z79899 Other long term (current) drug therapy: Secondary | ICD-10-CM | POA: Insufficient documentation

## 2013-10-03 DIAGNOSIS — Z880 Allergy status to penicillin: Secondary | ICD-10-CM | POA: Insufficient documentation

## 2013-10-03 DIAGNOSIS — Z9089 Acquired absence of other organs: Secondary | ICD-10-CM | POA: Insufficient documentation

## 2013-10-03 DIAGNOSIS — N200 Calculus of kidney: Secondary | ICD-10-CM | POA: Insufficient documentation

## 2013-10-03 LAB — CBC WITH DIFFERENTIAL/PLATELET
Basophils Absolute: 0 10*3/uL (ref 0.0–0.1)
Basophils Relative: 0 % (ref 0–1)
EOS PCT: 0 % (ref 0–5)
Eosinophils Absolute: 0 10*3/uL (ref 0.0–0.7)
HCT: 39.2 % (ref 36.0–46.0)
Hemoglobin: 13.1 g/dL (ref 12.0–15.0)
LYMPHS ABS: 1 10*3/uL (ref 0.7–4.0)
LYMPHS PCT: 10 % — AB (ref 12–46)
MCH: 31.5 pg (ref 26.0–34.0)
MCHC: 33.4 g/dL (ref 30.0–36.0)
MCV: 94.2 fL (ref 78.0–100.0)
Monocytes Absolute: 0.3 10*3/uL (ref 0.1–1.0)
Monocytes Relative: 4 % (ref 3–12)
Neutro Abs: 8.3 10*3/uL — ABNORMAL HIGH (ref 1.7–7.7)
Neutrophils Relative %: 86 % — ABNORMAL HIGH (ref 43–77)
PLATELETS: 200 10*3/uL (ref 150–400)
RBC: 4.16 MIL/uL (ref 3.87–5.11)
RDW: 12.4 % (ref 11.5–15.5)
WBC: 9.7 10*3/uL (ref 4.0–10.5)

## 2013-10-03 LAB — URINALYSIS, ROUTINE W REFLEX MICROSCOPIC
BILIRUBIN URINE: NEGATIVE
Glucose, UA: 250 mg/dL — AB
Ketones, ur: NEGATIVE mg/dL
LEUKOCYTES UA: NEGATIVE
NITRITE: NEGATIVE
PH: 6 (ref 5.0–8.0)
Protein, ur: NEGATIVE mg/dL
SPECIFIC GRAVITY, URINE: 1.025 (ref 1.005–1.030)
Urobilinogen, UA: 0.2 mg/dL (ref 0.0–1.0)

## 2013-10-03 LAB — COMPREHENSIVE METABOLIC PANEL
ALBUMIN: 4.1 g/dL (ref 3.5–5.2)
ALT: 11 U/L (ref 0–35)
AST: 17 U/L (ref 0–37)
Alkaline Phosphatase: 118 U/L — ABNORMAL HIGH (ref 39–117)
BILIRUBIN TOTAL: 0.3 mg/dL (ref 0.3–1.2)
BUN: 38 mg/dL — AB (ref 6–23)
CALCIUM: 9.6 mg/dL (ref 8.4–10.5)
CHLORIDE: 100 meq/L (ref 96–112)
CO2: 24 mEq/L (ref 19–32)
CREATININE: 1.25 mg/dL — AB (ref 0.50–1.10)
GFR calc Af Amer: 44 mL/min — ABNORMAL LOW (ref >90)
GFR calc non Af Amer: 38 mL/min — ABNORMAL LOW (ref >90)
Glucose, Bld: 236 mg/dL — ABNORMAL HIGH (ref 70–99)
Potassium: 4.7 mEq/L (ref 3.7–5.3)
Sodium: 139 mEq/L (ref 137–147)
TOTAL PROTEIN: 7.1 g/dL (ref 6.0–8.3)

## 2013-10-03 LAB — URINE MICROSCOPIC-ADD ON

## 2013-10-03 LAB — LIPASE, BLOOD: LIPASE: 94 U/L — AB (ref 11–59)

## 2013-10-03 MED ORDER — ONDANSETRON HCL 4 MG/2ML IJ SOLN
2.0000 mg | Freq: Once | INTRAMUSCULAR | Status: AC
  Administered 2013-10-03: 2 mg via INTRAVENOUS
  Filled 2013-10-03: qty 2

## 2013-10-03 MED ORDER — OXYCODONE-ACETAMINOPHEN 5-325 MG PO TABS: 1.0000 | ORAL_TABLET | ORAL | Status: DC | PRN

## 2013-10-03 MED ORDER — PROMETHAZINE HCL 25 MG PO TABS: 25.0000 mg | ORAL_TABLET | Freq: Four times a day (QID) | ORAL | Status: DC | PRN

## 2013-10-03 MED ORDER — IOHEXOL 300 MG/ML  SOLN
50.0000 mL | Freq: Once | INTRAMUSCULAR | Status: AC | PRN
  Administered 2013-10-03: 50 mL via ORAL

## 2013-10-03 MED ORDER — MORPHINE SULFATE 2 MG/ML IJ SOLN
2.0000 mg | Freq: Once | INTRAMUSCULAR | Status: AC
  Administered 2013-10-03: 2 mg via INTRAVENOUS
  Filled 2013-10-03: qty 1

## 2013-10-03 MED ORDER — KETOROLAC TROMETHAMINE 30 MG/ML IJ SOLN
30.0000 mg | Freq: Once | INTRAMUSCULAR | Status: AC
  Administered 2013-10-03: 30 mg via INTRAVENOUS
  Filled 2013-10-03: qty 1

## 2013-10-03 MED ORDER — SODIUM CHLORIDE 0.9 % IV BOLUS (SEPSIS)
500.0000 mL | Freq: Once | INTRAVENOUS | Status: AC
  Administered 2013-10-03: 500 mL via INTRAVENOUS

## 2013-10-03 MED ORDER — TAMSULOSIN HCL 0.4 MG PO CAPS: 0.4000 mg | ORAL_CAPSULE | Freq: Every day | ORAL | Status: DC

## 2013-10-03 MED ORDER — IOHEXOL 300 MG/ML  SOLN
100.0000 mL | Freq: Once | INTRAMUSCULAR | Status: AC | PRN
  Administered 2013-10-03: 100 mL via INTRAVENOUS

## 2013-10-03 NOTE — Discharge Instructions (Signed)
You have a 4 mm kidney stone on the left side. Followup with urologist. Medication for pain, nausea, and to increase urinary flow. Return if worse

## 2013-10-03 NOTE — ED Notes (Signed)
Pt starting generalized abdominal pain with nausea around 730-800 pm.

## 2013-10-03 NOTE — ED Provider Notes (Signed)
CSN: [id number]     Arrival date & time 10/03/13  0156 History   First MD Initiated Contact with Patient 10/03/13 0216     Chief Complaint  Patient presents with  . Abdominal Pain     (Consider location/radiation/quality/duration/timing/severity/associated sxs/prior Treatment) HPI.... Vague generalized abdominal pain since 7 PM last night, now more localized in the left lower quadrant. She has been eating well. No vomiting, diarrhea, fever, chills, dysuria. Normal bowel movements. Past surgical history abdominal hysterectomy and cholecystectomy. She is normally quite healthy. She does not typically have abdominal pain. No radiation of pain. Severity is mild to moderate.  Past Medical History  Diagnosis Date  . Hypertension   . Irregular heart beat   . Depression   . Dupuytren's contracture of both hands    Past Surgical History  Procedure Laterality Date  . Abdominal hysterectomy    . Dupuytren contracture release      left hand  . Laparoscopic cholecystectomy  03/19/12    Lovell Sheehan, +IOC  . Cholecystectomy  03/19/2012    Procedure: LAPAROSCOPIC CHOLECYSTECTOMY WITH INTRAOPERATIVE CHOLANGIOGRAM;  Surgeon: Dalia Heading, MD;  Location: [facility];  Service: General;  Laterality: N/A;  with common duct exploration  . Ercp N/A 03/21/2012    Procedure: ENDOSCOPIC RETROGRADE CHOLANGIOPANCREATOGRAPHY (ERCP);  Surgeon: Corbin Ade, MD;  Location: [facility];  Service: Endoscopy;  Laterality: N/A;  . Sphincterotomy N/A 03/21/2012    Procedure: SPHINCTEROTOMY;  Surgeon: Corbin Ade, MD;  Location: [facility];  Service: Endoscopy;  Laterality: N/A;  . Balloon dilation N/A 03/21/2012    Procedure: BALLOON DILATION;  Surgeon: Corbin Ade, MD;  Location: [facility];  Service: Endoscopy;  Laterality: N/A;  Balloon Stone Extraction  . Removal of stones N/A 03/21/2012    Procedure: REMOVAL OF STONES;  Surgeon: Corbin Ade, MD;  Location: [facility];  Service: Endoscopy;  Laterality: N/A;   History  reviewed. No pertinent family history. History  Substance Use Topics  . Smoking status: Never Smoker   . Smokeless tobacco: Never Used  . Alcohol Use: No   OB History   Grav Para Term Preterm Abortions TAB SAB Ect Mult Living                 Review of Systems  All other systems reviewed and are negative.     Allergies  Penicillins and Procaine hcl  Home Medications   Prior to Admission medications   Medication Sig Start Date End Date Taking? Authorizing Provider  aspirin EC 81 MG tablet Take 81 mg by mouth daily.   Yes Historical Provider, MD  diltiazem (CARDIZEM CD) 240 MG 24 hr capsule Take 1 capsule by mouth Daily. 02/20/12  Yes Historical Provider, MD  lisinopril (PRINIVIL,ZESTRIL) 40 MG tablet Take 40 mg by mouth daily.   Yes Historical Provider, MD  PARoxetine (PAXIL) 20 MG tablet Take 10 mg by mouth daily.    Yes Historical Provider, MD  Polyethyl Glycol-Propyl Glycol (SYSTANE) 0.4-0.3 % SOLN Place 1 drop into both eyes daily as needed. For dry eyes   Yes Historical Provider, MD  sodium chloride (OCEAN) 0.65 % nasal spray Place 1 spray into the nose as needed. For nasal dryness   Yes Historical Provider, MD   BP 148/73  Pulse 92  Temp(Src) 97.8 F (36.6 C) (Oral)  Resp 18  Ht 5\' 5"  (1.651 m)  Wt 135 lb (61.236 kg)  BMI 22.47 kg/m2  SpO2 95% Physical Exam  Nursing note and vitals  reviewed. Constitutional: She is oriented to person, place, and time. She appears well-developed and well-nourished.  HENT:  Head: Normocephalic and atraumatic.  Eyes: Conjunctivae and EOM are normal. Pupils are equal, round, and reactive to light.  Neck: Normal range of motion. Neck supple.  Cardiovascular: Normal rate, regular rhythm and normal heart sounds.   Pulmonary/Chest: Effort normal and breath sounds normal.  Abdominal: Soft. Bowel sounds are normal.  Minimal left lower quadrant tenderness  Musculoskeletal: Normal range of motion.  Neurological: She is alert and oriented to  person, place, and time.  Skin: Skin is warm and dry.  Psychiatric: She has a normal mood and affect. Her behavior is normal.    ED Course  Procedures (including critical care time) Labs Review Labs Reviewed  COMPREHENSIVE METABOLIC PANEL - Abnormal; Notable for the following:    Glucose, Bld 236 (*)    BUN 38 (*)    Creatinine, Ser 1.25 (*)    Alkaline Phosphatase 118 (*)    GFR calc non Af Amer 38 (*)    GFR calc Af Amer 44 (*)    All other components within normal limits  CBC WITH DIFFERENTIAL - Abnormal; Notable for the following:    Neutrophils Relative % 86 (*)    Neutro Abs 8.3 (*)    Lymphocytes Relative 10 (*)    All other components within normal limits  LIPASE, BLOOD - Abnormal; Notable for the following:    Lipase 94 (*)    All other components within normal limits  URINALYSIS, ROUTINE W REFLEX MICROSCOPIC - Abnormal; Notable for the following:    Glucose, UA 250 (*)    Hgb urine dipstick LARGE (*)    All other components within normal limits  URINE MICROSCOPIC-ADD ON - Abnormal; Notable for the following:    Squamous Epithelial / LPF FEW (*)    Bacteria, UA FEW (*)    All other components within normal limits    Imaging Review Ct Abdomen W Contrast  10/03/2013   CLINICAL DATA:  Abdominal pain and vomiting.  EXAM: CT ABDOMEN WITH CONTRAST  TECHNIQUE: Multidetector CT imaging of the abdomen was performed using the standard protocol following bolus administration of intravenous contrast.  CONTRAST:  50mL OMNIPAQUE IOHEXOL 300 MG/ML SOLN, 100mL OMNIPAQUE IOHEXOL 300 MG/ML SOLN  COMPARISON:  DG ABD ACUTE W/CHEST dated 10/03/2013  FINDINGS: Motion artifact and linear scarring in the lung bases.  There is a 4 mm stone in the proximal left ureter at the ureterovesical junction with mild pyelocaliectasis and significant tear or renal and periureteral stranding consistent with of moderate degree of obstruction. Bilateral renal cysts, largest on the left upper pole measuring 2  cm diameter. Surgical absence of the gallbladder. Bile duct dilatation consistent with postoperative physiology. The liver, spleen, pancreas, adrenal glands, abdominal aorta, inferior vena cava, and retroperitoneal lymph nodes are unremarkable. Small esophageal hiatal hernia. Stomach and small bowel are otherwise unremarkable. Scattered stool in the colon without distention. No free air in the abdomen.  Pelvis: Bladder wall is not thickened. Uterus appears surgically absent. No free fluid or loculated fluid collection. Appendix is not identified. No pelvic mass or lymphadenopathy. Degenerative changes in the spine. No destructive bone lesions.  IMPRESSION: 4 mm stone in the proximal left ureter with moderate proximal obstruction.   Electronically Signed   By: Burman NievesWilliam  Stevens M.D.   On: 10/03/2013 05:56   Dg Abd Acute W/chest  10/03/2013   CLINICAL DATA:  Abdominal pain, nausea and vomiting.  EXAM:  ACUTE ABDOMEN SERIES (ABDOMEN 2 VIEW & CHEST 1 VIEW)  COMPARISON:  DG ERCP WITH SPHINCTEROTOMY dated 03/21/2012; DG CHEST 2 VIEW dated 08/02/2011  FINDINGS: Hyperinflation and scattered interstitial changes suggesting emphysema. Normal heart size and pulmonary vascularity. Calcified granulomas in the lungs. No focal airspace disease or consolidation.  Scattered gas and stool in the colon. No small or large bowel distention. No free intra-abdominal air. No abnormal air-fluid levels. Surgical clips in the right upper quadrant and pelvis. Degenerative changes in the spine and hips. No radiopaque stones.  IMPRESSION: Emphysematous changes in the lungs. No evidence of active pulmonary disease. Nonobstructive bowel gas pattern.   Electronically Signed   By: Burman NievesWilliam  Stevens M.D.   On: 10/03/2013 03:36     EKG Interpretation   Date/Time:  Thursday Oct 03 2013 02:19:17 EDT Ventricular Rate:  69 PR Interval:  229 QRS Duration: 92 QT Interval:  424 QTC Calculation: 454 R Axis:   46 Text Interpretation:  Sinus rhythm  Atrial premature complexes Prolonged PR  interval Anterior infarct, old Confirmed by Mililani Murthy  MD, Andera Cranmer ([id number]) on  10/03/2013 2:47:39 AM      MDM   Final diagnoses:  Kidney stone on left side  Elevated lipase  Hyperglycemia    CT scan reveals a 4 mm stone in the proximal left ureter. These findings were discussed with patient and her son. Also noted were elevated glucose and lipase. This also was discussed. Followup with urologist. Discharge medications Percocet, Phenergan 25 mg, Flomax 0.4 mg. No acute abdomen.    Donnetta HutchingBrian Shifra Swartzentruber, MD 10/03/13 [phone]

## 2013-10-03 NOTE — ED Notes (Signed)
Pt states she is unable to void at this time. Pt instructed to notify staff when she is able to urinate. Pt verbalized understanding.

## 2013-10-03 NOTE — ED Notes (Signed)
MD at bedside. 

## 2013-10-15 ENCOUNTER — Emergency Department (HOSPITAL_COMMUNITY)
Admission: EM | Admit: 2013-10-15 | Discharge: 2013-10-15 | Disposition: A | Discharge status: Home / Self Care | Payer: Medicare Other | Attending: Emergency Medicine | Admitting: Emergency Medicine

## 2013-10-15 ENCOUNTER — Emergency Department (HOSPITAL_COMMUNITY): Payer: Medicare Other

## 2013-10-15 ENCOUNTER — Encounter (HOSPITAL_COMMUNITY): Payer: Self-pay | Admitting: Emergency Medicine

## 2013-10-15 DIAGNOSIS — Z880 Allergy status to penicillin: Secondary | ICD-10-CM | POA: Insufficient documentation

## 2013-10-15 DIAGNOSIS — Z90710 Acquired absence of both cervix and uterus: Secondary | ICD-10-CM | POA: Insufficient documentation

## 2013-10-15 DIAGNOSIS — N390 Urinary tract infection, site not specified: Secondary | ICD-10-CM | POA: Insufficient documentation

## 2013-10-15 DIAGNOSIS — Z79899 Other long term (current) drug therapy: Secondary | ICD-10-CM | POA: Insufficient documentation

## 2013-10-15 DIAGNOSIS — Z8659 Personal history of other mental and behavioral disorders: Secondary | ICD-10-CM | POA: Insufficient documentation

## 2013-10-15 DIAGNOSIS — Z8739 Personal history of other diseases of the musculoskeletal system and connective tissue: Secondary | ICD-10-CM | POA: Insufficient documentation

## 2013-10-15 DIAGNOSIS — N201 Calculus of ureter: Secondary | ICD-10-CM

## 2013-10-15 DIAGNOSIS — I499 Cardiac arrhythmia, unspecified: Secondary | ICD-10-CM | POA: Insufficient documentation

## 2013-10-15 DIAGNOSIS — I10 Essential (primary) hypertension: Secondary | ICD-10-CM | POA: Insufficient documentation

## 2013-10-15 DIAGNOSIS — Z9089 Acquired absence of other organs: Secondary | ICD-10-CM | POA: Insufficient documentation

## 2013-10-15 DIAGNOSIS — Z7982 Long term (current) use of aspirin: Secondary | ICD-10-CM | POA: Insufficient documentation

## 2013-10-15 HISTORY — DX: Calculus of kidney: N20.0

## 2013-10-15 LAB — URINALYSIS, ROUTINE W REFLEX MICROSCOPIC
GLUCOSE, UA: NEGATIVE mg/dL
Ketones, ur: NEGATIVE mg/dL
Nitrite: NEGATIVE
Protein, ur: 100 mg/dL — AB
SPECIFIC GRAVITY, URINE: 1.024 (ref 1.005–1.030)
Urobilinogen, UA: 1 mg/dL (ref 0.0–1.0)
pH: 5 (ref 5.0–8.0)

## 2013-10-15 LAB — CBC WITH DIFFERENTIAL/PLATELET
BASOS ABS: 0 10*3/uL (ref 0.0–0.1)
Basophils Relative: 0 % (ref 0–1)
Eosinophils Absolute: 0.1 10*3/uL (ref 0.0–0.7)
Eosinophils Relative: 1 % (ref 0–5)
HCT: 33.9 % — ABNORMAL LOW (ref 36.0–46.0)
Hemoglobin: 11.4 g/dL — ABNORMAL LOW (ref 12.0–15.0)
LYMPHS ABS: 1.5 10*3/uL (ref 0.7–4.0)
LYMPHS PCT: 16 % (ref 12–46)
MCH: 31.8 pg (ref 26.0–34.0)
MCHC: 33.6 g/dL (ref 30.0–36.0)
MCV: 94.7 fL (ref 78.0–100.0)
Monocytes Absolute: 1.1 10*3/uL — ABNORMAL HIGH (ref 0.1–1.0)
Monocytes Relative: 11 % (ref 3–12)
NEUTROS ABS: 6.7 10*3/uL (ref 1.7–7.7)
NEUTROS PCT: 72 % (ref 43–77)
PLATELETS: 197 10*3/uL (ref 150–400)
RBC: 3.58 MIL/uL — AB (ref 3.87–5.11)
RDW: 12.7 % (ref 11.5–15.5)
WBC: 9.4 10*3/uL (ref 4.0–10.5)

## 2013-10-15 LAB — I-STAT CHEM 8, ED
BUN: 25 mg/dL — AB (ref 6–23)
CHLORIDE: 100 meq/L (ref 96–112)
CREATININE: 1.4 mg/dL — AB (ref 0.50–1.10)
Calcium, Ion: 1.21 mmol/L (ref 1.13–1.30)
Glucose, Bld: 105 mg/dL — ABNORMAL HIGH (ref 70–99)
HCT: 34 % — ABNORMAL LOW (ref 36.0–46.0)
Hemoglobin: 11.6 g/dL — ABNORMAL LOW (ref 12.0–15.0)
POTASSIUM: 3.9 meq/L (ref 3.7–5.3)
Sodium: 135 mEq/L — ABNORMAL LOW (ref 137–147)
TCO2: 22 mmol/L (ref 0–100)

## 2013-10-15 LAB — URINE MICROSCOPIC-ADD ON

## 2013-10-15 MED ORDER — SULFAMETHOXAZOLE-TMP DS 800-160 MG PO TABS
1.0000 | ORAL_TABLET | Freq: Once | ORAL | Status: AC
  Administered 2013-10-15: 1 via ORAL
  Filled 2013-10-15: qty 1

## 2013-10-15 MED ORDER — SULFAMETHOXAZOLE-TMP DS 800-160 MG PO TABS: 1.0000 | ORAL_TABLET | Freq: Two times a day (BID) | ORAL | Status: DC

## 2013-10-15 NOTE — ED Provider Notes (Signed)
CSN: [id number]     Arrival date & time 10/15/13  2002 History   First MD Initiated Contact with Patient 10/15/13 2043     Chief Complaint  Patient presents with  . Flank Pain    left side     (Consider location/radiation/quality/duration/timing/severity/associated sxs/prior Treatment) Patient is a 80 y.o. female presenting with flank pain. The history is provided by the patient.  Flank Pain This is a recurrent problem. Pertinent negatives include no chest pain, no abdominal pain, no headaches and no shortness of breath.   patient's had left flank pain on and off for the last 2 weeks. She has a known 4 mm kidney stone. She has been seen by urology who is monitoring the patient. She states today she had some difficulty urinating and felt as if she had to go. She stays very little came out for. The pain is moved more towards her groin now. No fevers. No nausea vomiting. No hematuria. No lightheadedness or dizziness  Past Medical History  Diagnosis Date  . Hypertension   . Irregular heart beat   . Depression   . Dupuytren's contracture of both hands   . Kidney stone    Past Surgical History  Procedure Laterality Date  . Abdominal hysterectomy    . Dupuytren contracture release      left hand  . Laparoscopic cholecystectomy  03/19/12    Lovell Sheehan, +IOC  . Cholecystectomy  03/19/2012    Procedure: LAPAROSCOPIC CHOLECYSTECTOMY WITH INTRAOPERATIVE CHOLANGIOGRAM;  Surgeon: Dalia Heading, MD;  Location: [facility];  Service: General;  Laterality: N/A;  with common duct exploration  . Ercp N/A 03/21/2012    Procedure: ENDOSCOPIC RETROGRADE CHOLANGIOPANCREATOGRAPHY (ERCP);  Surgeon: Corbin Ade, MD;  Location: [facility];  Service: Endoscopy;  Laterality: N/A;  . Sphincterotomy N/A 03/21/2012    Procedure: SPHINCTEROTOMY;  Surgeon: Corbin Ade, MD;  Location: [facility];  Service: Endoscopy;  Laterality: N/A;  . Balloon dilation N/A 03/21/2012    Procedure: BALLOON DILATION;  Surgeon: Corbin Ade, MD;  Location: [facility];  Service: Endoscopy;  Laterality: N/A;  Balloon Stone Extraction  . Removal of stones N/A 03/21/2012    Procedure: REMOVAL OF STONES;  Surgeon: Corbin Ade, MD;  Location: [facility];  Service: Endoscopy;  Laterality: N/A;   No family history on file. History  Substance Use Topics  . Smoking status: Never Smoker   . Smokeless tobacco: Never Used  . Alcohol Use: No   OB History   Grav Para Term Preterm Abortions TAB SAB Ect Mult Living                 Review of Systems  Constitutional: Negative for activity change and appetite change.  Eyes: Negative for pain.  Respiratory: Negative for chest tightness and shortness of breath.   Cardiovascular: Negative for chest pain and leg swelling.  Gastrointestinal: Negative for nausea, vomiting, abdominal pain and diarrhea.  Genitourinary: Positive for frequency and flank pain.  Musculoskeletal: Negative for back pain and neck stiffness.  Skin: Negative for rash.  Neurological: Negative for weakness, numbness and headaches.  Psychiatric/Behavioral: Negative for behavioral problems.      Allergies  Penicillins and Procaine hcl  Home Medications   Prior to Admission medications   Medication Sig Start Date End Date Taking? Authorizing Provider  aspirin EC 81 MG tablet Take 81 mg by mouth daily.   Yes Historical Provider, MD  diltiazem (CARDIZEM CD) 240 MG 24 hr capsule Take 1 capsule  by mouth Daily. 02/20/12  Yes Historical Provider, MD  lisinopril (PRINIVIL,ZESTRIL) 40 MG tablet Take 40 mg by mouth daily.   Yes Historical Provider, MD  oxyCODONE-acetaminophen (PERCOCET) 5-325 MG per tablet Take 0.5 tablets by mouth every 4 (four) hours as needed for severe pain.   Yes Historical Provider, MD  PARoxetine (PAXIL) 20 MG tablet Take 10 mg by mouth daily.    Yes Historical Provider, MD  Polyethyl Glycol-Propyl Glycol (SYSTANE) 0.4-0.3 % SOLN Place 1 drop into both eyes daily as needed. For dry eyes   Yes Historical  Provider, MD  tamsulosin (FLOMAX) 0.4 MG CAPS capsule Take 1 capsule (0.4 mg total) by mouth daily. 10/03/13  Yes Donnetta Hutching, MD  promethazine (PHENERGAN) 25 MG tablet Take 1 tablet (25 mg total) by mouth every 6 (six) hours as needed. 10/03/13   Donnetta Hutching, MD   BP 99/49  Pulse 71  Temp(Src) 98.4 F (36.9 C) (Oral)  Resp 16  SpO2 97% Physical Exam  Nursing note and vitals reviewed. Constitutional: She is oriented to person, place, and time. She appears well-developed and well-nourished.  Patient appears younger than stated age  HENT:  Head: Normocephalic and atraumatic.  Eyes: EOM are normal. Pupils are equal, round, and reactive to light.  Neck: Normal range of motion. Neck supple.  Cardiovascular: Normal rate, regular rhythm and normal heart sounds.   No murmur heard. Pulmonary/Chest: Effort normal and breath sounds normal. No respiratory distress. She has no wheezes. She has no rales.  Abdominal: Soft. Bowel sounds are normal. She exhibits no distension. There is no tenderness. There is no rebound and no guarding.  Genitourinary:  Mild CVA tenderness on left  Musculoskeletal: Normal range of motion.  Neurological: She is alert and oriented to person, place, and time. No cranial nerve deficit.  Skin: Skin is warm and dry.  Psychiatric: She has a normal mood and affect. Her speech is normal.    ED Course  Procedures (including critical care time) Labs Review Labs Reviewed  CBC WITH DIFFERENTIAL - Abnormal; Notable for the following:    RBC 3.58 (*)    Hemoglobin 11.4 (*)    HCT 33.9 (*)    Monocytes Absolute 1.1 (*)    All other components within normal limits  URINALYSIS, ROUTINE W REFLEX MICROSCOPIC - Abnormal; Notable for the following:    Color, Urine ORANGE (*)    APPearance TURBID (*)    Hgb urine dipstick LARGE (*)    Bilirubin Urine MODERATE (*)    Protein, ur 100 (*)    Leukocytes, UA LARGE (*)    All other components within normal limits  URINE  MICROSCOPIC-ADD ON - Abnormal; Notable for the following:    Bacteria, UA MANY (*)    All other components within normal limits  I-STAT CHEM 8, ED - Abnormal; Notable for the following:    Sodium 135 (*)    BUN 25 (*)    Creatinine, Ser 1.40 (*)    Glucose, Bld 105 (*)    Hemoglobin 11.6 (*)    HCT 34.0 (*)    All other components within normal limits  URINE CULTURE    Imaging Review Dg Abd 1 View  10/15/2013   CLINICAL DATA:  Left-sided abdominal pain.  EXAM: ABDOMEN - 1 VIEW  COMPARISON:  CT of the abdomen and pelvis, and abdominal radiograph, performed 10/03/2013  FINDINGS: The visualized bowel gas pattern is unremarkable. Scattered air and stool filled loops of colon are seen; no abnormal  dilatation of small bowel loops is seen to suggest small bowel obstruction. No free intra-abdominal air is identified, though evaluation for free air is limited on a single supine view. Clips are noted within the right upper quadrant, reflecting prior cholecystectomy.  The known left-sided ureteral stone is not well characterized on radiograph; it is no longer seen at its prior position.  The visualized osseous structures are within normal limits; the sacroiliac joints are unremarkable in appearance. The visualized lung bases are essentially clear.  IMPRESSION: 1. Unremarkable bowel gas pattern; no free intra-abdominal air seen. Relatively small amount of stool noted in the colon. 2. Known left-sided ureteral stone is not well characterized on radiograph; it is no longer seen at its prior position.   Electronically Signed   By: Roanna RaiderJeffery  Chang M.D.   On: 10/15/2013 21:48     EKG Interpretation None      MDM   Final diagnoses:  Ureteral stone  UTI (urinary tract infection)    Patient with known ureteral stone. Pain is changed somewhat    and she states she feels as if she has to urinate. urinalysis shows infection. Patient does not have an elevated white count. Her creatinine is mildly increased.  She is well-appearing. This does not appear to be a severe infected obstructed stone. Discussed with Dr. Berneice HeinrichManny, who will see the patient in followup. We'll start Bactrim and cultures have been sent. KUB does not show stone and this likely move from his previous.  Juliet RudeNathan R. Rubin PayorPickering, MD 10/15/13 2340

## 2013-10-15 NOTE — ED Notes (Signed)
Patient transported to X-ray 

## 2013-10-15 NOTE — Discharge Instructions (Signed)
Kidney Stones Kidney stones (urolithiasis) are deposits that form inside your kidneys. The intense pain is caused by the stone moving through the urinary tract. When the stone moves, the ureter goes into spasm around the stone. The stone is usually passed in the urine.  CAUSES   A disorder that makes certain neck glands produce too much parathyroid hormone (primary hyperparathyroidism).  A buildup of uric acid crystals, similar to gout in your joints.  Narrowing (stricture) of the ureter.  A kidney obstruction present at birth (congenital obstruction).  Previous surgery on the kidney or ureters.  Numerous kidney infections. SYMPTOMS   Feeling sick to your stomach (nauseous).  Throwing up (vomiting).  Blood in the urine (hematuria).  Pain that usually spreads (radiates) to the groin.  Frequency or urgency of urination. DIAGNOSIS   Taking a history and physical exam.  Blood or urine tests.  CT scan.  Occasionally, an examination of the inside of the urinary bladder (cystoscopy) is performed. TREATMENT   Observation.  Increasing your fluid intake.  Extracorporeal shock wave lithotripsy This is a noninvasive procedure that uses shock waves to break up kidney stones.  Surgery may be needed if you have severe pain or persistent obstruction. There are various surgical procedures. Most of the procedures are performed with the use of small instruments. Only small incisions are needed to accommodate these instruments, so recovery time is minimized. The size, location, and chemical composition are all important variables that will determine the proper choice of action for you. Talk to your health care provider to better understand your situation so that you will minimize the risk of injury to yourself and your kidney.  HOME CARE INSTRUCTIONS   Drink enough water and fluids to keep your urine clear or pale yellow. This will help you to pass the stone or stone fragments.  Strain  all urine through the provided strainer. Keep all particulate matter and stones for your health care provider to see. The stone causing the pain may be as small as a grain of salt. It is very important to use the strainer each and every time you pass your urine. The collection of your stone will allow your health care provider to analyze it and verify that a stone has actually passed. The stone analysis will often identify what you can do to reduce the incidence of recurrences.  Only take over-the-counter or prescription medicines for pain, discomfort, or fever as directed by your health care provider.  Make a follow-up appointment with your health care provider as directed.  Get follow-up X-rays if required. The absence of pain does not always mean that the stone has passed. It may have only stopped moving. If the urine remains completely obstructed, it can cause loss of kidney function or even complete destruction of the kidney. It is your responsibility to make sure X-rays and follow-ups are completed. Ultrasounds of the kidney can show blockages and the status of the kidney. Ultrasounds are not associated with any radiation and can be performed easily in a matter of minutes. SEEK MEDICAL CARE IF:  You experience pain that is progressive and unresponsive to any pain medicine you have been prescribed. SEEK IMMEDIATE MEDICAL CARE IF:   Pain cannot be controlled with the prescribed medicine.  You have a fever or shaking chills.  The severity or intensity of pain increases over 18 hours and is not relieved by pain medicine.  You develop a new onset of abdominal pain.  You feel faint or pass   out.  You are unable to urinate. MAKE SURE YOU:   Understand these instructions.  Will watch your condition.  Will get help right away if you are not doing well or get worse. Document Released: 05/09/2005 Document Revised: 01/09/2013 Document Reviewed: 10/10/2012 Exit[employer] Patient Information 2014  [employer] Urinary Tract Infection Urinary tract infections (UTIs) can develop anywhere along your urinary tract. Your urinary tract is your body's drainage system for removing wastes and extra water. Your urinary tract includes two kidneys, two ureters, a bladder, and a urethra. Your kidneys are a pair of bean-shaped organs. Each kidney is about the size of your fist. They are located below your ribs, one on each side of your spine. CAUSES Infections are caused by microbes, which are microscopic organisms, including fungi, viruses, and bacteria. These organisms are so small that they can only be seen through a microscope. Bacteria are the microbes that most commonly cause UTIs. SYMPTOMS  Symptoms of UTIs may vary by age and gender of the patient and by the location of the infection. Symptoms in young women typically include a frequent and intense urge to urinate and a painful, burning feeling in the bladder or urethra during urination. Older women and men are more likely to be tired, shaky, and weak and have muscle aches and abdominal pain. A fever may mean the infection is in your kidneys. Other symptoms of a kidney infection include pain in your back or sides below the ribs, nausea, and vomiting. DIAGNOSIS To diagnose a UTI, your caregiver will ask you about your symptoms. Your caregiver also will ask to provide a urine sample. The urine sample will be tested for bacteria and white blood cells. White blood cells are made by your body to help fight infection. TREATMENT  Typically, UTIs can be treated with medication. Because most UTIs are caused by a bacterial infection, they usually can be treated with the use of antibiotics. The choice of antibiotic and length of treatment depend on your symptoms and the type of bacteria causing your infection. HOME CARE INSTRUCTIONS  If you were prescribed antibiotics, take them exactly as your caregiver instructs you. Finish the medication even if you feel  better after you have only taken some of the medication.  Drink enough water and fluids to keep your urine clear or pale yellow.  Avoid caffeine, tea, and carbonated beverages. They tend to irritate your bladder.  Empty your bladder often. Avoid holding urine for long periods of time.  Empty your bladder before and after sexual intercourse.  After a bowel movement, women should cleanse from front to back. Use each tissue only once. SEEK MEDICAL CARE IF:   You have back pain.  You develop a fever.  Your symptoms do not begin to resolve within 3 days. SEEK IMMEDIATE MEDICAL CARE IF:   You have severe back pain or lower abdominal pain.  You develop chills.  You have nausea or vomiting.  You have continued burning or discomfort with urination. MAKE SURE YOU:   Understand these instructions.  Will watch your condition.  Will get help right away if you are not doing well or get worse. Document Released: 02/16/2005 Document Revised: 11/08/2011 Document Reviewed: 06/17/2011 [employer] Patient Information 2014 [employer]  

## 2013-10-15 NOTE — ED Notes (Signed)
Patient is alert and oriented x3.  She is complaining of left flank pain that started thrusday early morining.  She was seen at [facility] for this issue and released To see Dr. Kathrynn Running.  Dr. Kathrynn Running was seen on Friday and instructed her that if she had any More issues to come to the ED.  Currently she rates her pain 2 of 10 with difficulty urinated.

## 2013-10-16 LAB — URINE CULTURE: Colony Count: 90000

## 2014-07-02 ENCOUNTER — Emergency Department (HOSPITAL_COMMUNITY): Payer: Medicare Other

## 2014-07-02 ENCOUNTER — Inpatient Hospital Stay (HOSPITAL_COMMUNITY)
Admission: EM | Admit: 2014-07-02 | Discharge: 2014-07-07 | DRG: 494 | Disposition: A | Discharge status: Skilled Nursing Facility | Payer: Medicare Other | Attending: Orthopedic Surgery | Admitting: Orthopedic Surgery

## 2014-07-02 ENCOUNTER — Encounter (HOSPITAL_COMMUNITY): Payer: Self-pay

## 2014-07-02 DIAGNOSIS — S82853A Displaced trimalleolar fracture of unspecified lower leg, initial encounter for closed fracture: Secondary | ICD-10-CM | POA: Diagnosis present

## 2014-07-02 DIAGNOSIS — Y92018 Other place in single-family (private) house as the place of occurrence of the external cause: Secondary | ICD-10-CM

## 2014-07-02 DIAGNOSIS — Z602 Problems related to living alone: Secondary | ICD-10-CM | POA: Diagnosis present

## 2014-07-02 DIAGNOSIS — S82891A Other fracture of right lower leg, initial encounter for closed fracture: Secondary | ICD-10-CM | POA: Diagnosis present

## 2014-07-02 DIAGNOSIS — X58XXXA Exposure to other specified factors, initial encounter: Secondary | ICD-10-CM | POA: Diagnosis present

## 2014-07-02 DIAGNOSIS — Z880 Allergy status to penicillin: Secondary | ICD-10-CM | POA: Diagnosis not present

## 2014-07-02 DIAGNOSIS — S82851A Displaced trimalleolar fracture of right lower leg, initial encounter for closed fracture: Principal | ICD-10-CM

## 2014-07-02 DIAGNOSIS — F329 Major depressive disorder, single episode, unspecified: Secondary | ICD-10-CM | POA: Diagnosis present

## 2014-07-02 DIAGNOSIS — Z888 Allergy status to other drugs, medicaments and biological substances status: Secondary | ICD-10-CM

## 2014-07-02 DIAGNOSIS — Z79899 Other long term (current) drug therapy: Secondary | ICD-10-CM

## 2014-07-02 DIAGNOSIS — Z7982 Long term (current) use of aspirin: Secondary | ICD-10-CM

## 2014-07-02 DIAGNOSIS — Y9389 Activity, other specified: Secondary | ICD-10-CM | POA: Diagnosis not present

## 2014-07-02 DIAGNOSIS — T148XXA Other injury of unspecified body region, initial encounter: Secondary | ICD-10-CM

## 2014-07-02 DIAGNOSIS — M25571 Pain in right ankle and joints of right foot: Secondary | ICD-10-CM | POA: Diagnosis present

## 2014-07-02 DIAGNOSIS — I10 Essential (primary) hypertension: Secondary | ICD-10-CM | POA: Diagnosis present

## 2014-07-02 MED ORDER — FENTANYL CITRATE 0.05 MG/ML IJ SOLN
50.0000 ug | Freq: Once | INTRAMUSCULAR | Status: AC
  Administered 2014-07-02: 50 ug via INTRAVENOUS
  Filled 2014-07-02: qty 2

## 2014-07-02 MED ORDER — PROPOFOL 10 MG/ML IV BOLUS
0.5000 mg/kg | Freq: Once | INTRAVENOUS | Status: AC
  Administered 2014-07-02: 30.6 mg via INTRAVENOUS
  Filled 2014-07-02: qty 20

## 2014-07-02 NOTE — ED Notes (Signed)
Assisted pt on bedpan. 

## 2014-07-02 NOTE — ED Notes (Signed)
Patient went to step up today, twisted right ankle on the way down, admits to hearing a cracking sound. Obvious deformity noted to right ankle, pedal pulse present.

## 2014-07-02 NOTE — Sedation Documentation (Signed)
Awake and talking at this time

## 2014-07-02 NOTE — ED Provider Notes (Signed)
CSN: [id number]     Arrival date & time 07/02/14  1648 History  This chart was scribed for [facility] R. Rubin Payor, MD by Richarda Overlie, ED Scribe. This patient was seen in room APA09/APA09 and the patient's care was started 5:53 PM.    Chief Complaint  Patient presents with  . Ankle Pain   The history is provided by the patient. No language interpreter was used.   HPI Comments: [name] is a 81 y.o. female with a history of HTN who presents to the Emergency Department complaining of right ankle pain that occurred PTA. Pt says she went to step up today and twisted her right ankle on the way down. Pt admits to hearing a cracking sound. She describes the pain as burning and states that it worsens with movement.  She denies any head trauma or any other injuries. She says that she has not eaten dinner tonight. Pt reports no pertinent past medical history at this time. She states that she has not had issues with anesthesia in the past.   Past Medical History  Diagnosis Date  . Hypertension   . Irregular heart beat   . Depression   . Dupuytren's contracture of both hands   . Kidney stone    Past Surgical History  Procedure Laterality Date  . Abdominal hysterectomy    . Dupuytren contracture release      left hand  . Laparoscopic cholecystectomy  03/19/12    Lovell Sheehan, +IOC  . Cholecystectomy  03/19/2012    Procedure: LAPAROSCOPIC CHOLECYSTECTOMY WITH INTRAOPERATIVE CHOLANGIOGRAM;  Surgeon: Dalia Heading, MD;  Location: [facility];  Service: General;  Laterality: N/A;  with common duct exploration  . Ercp N/A 03/21/2012    Procedure: ENDOSCOPIC RETROGRADE CHOLANGIOPANCREATOGRAPHY (ERCP);  Surgeon: Corbin Ade, MD;  Location: [facility];  Service: Endoscopy;  Laterality: N/A;  . Sphincterotomy N/A 03/21/2012    Procedure: SPHINCTEROTOMY;  Surgeon: Corbin Ade, MD;  Location: [facility];  Service: Endoscopy;  Laterality: N/A;  . Balloon dilation N/A 03/21/2012    Procedure: BALLOON DILATION;   Surgeon: Corbin Ade, MD;  Location: [facility];  Service: Endoscopy;  Laterality: N/A;  Balloon Stone Extraction  . Removal of stones N/A 03/21/2012    Procedure: REMOVAL OF STONES;  Surgeon: Corbin Ade, MD;  Location: [facility];  Service: Endoscopy;  Laterality: N/A;   History reviewed. No pertinent family history. History  Substance Use Topics  . Smoking status: Never Smoker   . Smokeless tobacco: Never Used  . Alcohol Use: No   OB History    No data available     Review of Systems  Musculoskeletal: Positive for arthralgias.  All other systems reviewed and are negative.   Allergies  Penicillins and Procaine hcl  Home Medications   Prior to Admission medications   Medication Sig Start Date End Date Taking? Authorizing Provider  aspirin EC 81 MG tablet Take 81 mg by mouth daily.   Yes Historical Provider, MD  diltiazem (CARDIZEM CD) 240 MG 24 hr capsule Take 1 capsule by mouth Daily. 02/20/12  Yes Historical Provider, MD  lisinopril (PRINIVIL,ZESTRIL) 40 MG tablet Take 40 mg by mouth daily.   Yes Historical Provider, MD  PARoxetine (PAXIL) 20 MG tablet Take 10 mg by mouth daily.   Yes Historical Provider, MD  Polyethyl Glycol-Propyl Glycol (SYSTANE) 0.4-0.3 % SOLN Place 1 drop into both eyes daily as needed. For dry eyes    Historical Provider, MD  promethazine (PHENERGAN)  25 MG tablet Take 1 tablet (25 mg total) by mouth every 6 (six) hours as needed. Patient not taking: Reported on 07/02/2014 10/03/13   Donnetta HutchingBrian Cook, MD  sulfamethoxazole-trimethoprim (BACTRIM DS) 800-160 MG per tablet Take 1 tablet by mouth 2 (two) times daily. Patient not taking: Reported on 07/02/2014 10/15/13   Juliet RudeNathan R. Holli Rengel, MD  tamsulosin (FLOMAX) 0.4 MG CAPS capsule Take 1 capsule (0.4 mg total) by mouth daily. Patient not taking: Reported on 07/02/2014 10/03/13   Donnetta HutchingBrian Cook, MD   BP 156/55 mmHg  Pulse 99  Temp(Src) 98.2 F (36.8 C) (Oral)  Resp 18  Ht 5\' 5"  (1.651 m)  Wt 135 lb (61.236 kg)  BMI  22.47 kg/m2  SpO2 95% Physical Exam  Constitutional: She is oriented to person, place, and time. She appears well-developed and well-nourished.  HENT:  Head: Normocephalic and atraumatic.  Neck: Neck supple.  Cardiovascular: Normal rate.   Pulmonary/Chest: Effort normal. No respiratory distress.  Abdominal: She exhibits no distension. There is no tenderness.  Musculoskeletal:  No tenderness over hip, knee, head of the fibula. Lateral angulation of the foot at the ankle some tinting of the skin medially. Skin is intact there is strong dorsalis pedis pulse. Strong sensation over the foot, able to wiggle her toes.   Neurological: She is alert and oriented to person, place, and time.  Skin: Skin is warm and dry.  Psychiatric: She has a normal mood and affect.  Nursing note and vitals reviewed.   ED Course  ORTHOPEDIC INJURY TREATMENT Date/Time: 07/02/2014 9:00 PM Performed by: Benjiman CorePICKERING, Kaia Depaolis R. Authorized by: Billee CashingPICKERING, Adelise Buswell R. Consent: Verbal consent obtained. Written consent obtained. Risks and benefits: risks, benefits and alternatives were discussed Consent given by: patient Patient understanding: patient states understanding of the procedure being performed Patient consent: the patient's understanding of the procedure matches consent given Procedure consent: procedure consent matches procedure scheduled Relevant documents: relevant documents present and verified Test results: test results available and properly labeled Site marked: the operative site was marked Imaging studies: imaging studies available Required items: required blood products, implants, devices, and special equipment available Patient identity confirmed: verbally with patient and arm band Time out: Immediately prior to procedure a "time out" was called to verify the correct patient, procedure, equipment, support staff and site/side marked as required. Injury location: ankle Location details: right  ankle Injury type: fracture-dislocation Fracture type: trimalleolar Pre-procedure neurovascular assessment: neurovascularly intact Pre-procedure distal perfusion: normal Pre-procedure neurological function: normal Pre-procedure range of motion: reduced Local anesthesia used: no Patient sedated: yes Sedation type: moderate (conscious) sedation Sedatives: propofol Vitals: Vital signs were monitored during sedation. (See nursing documention for times of procedure start and stop. ) Manipulation performed: yes Reduction successful: no Immobilization: splint Splint type: short leg Supplies used: Ortho-Glass Post-procedure neurovascular assessment: post-procedure neurovascularly intact Post-procedure distal perfusion: normal Post-procedure neurological function: normal Post-procedure range of motion: unchanged Patient tolerance: Patient tolerated the procedure well with no immediate complications Comments: Unsuccessful reduction       COORDINATION OF CARE: 6:04 PM Discussed treatment plan with pt at bedside and pt agreed to plan.   Labs Review Labs Reviewed - No data to display  Imaging Review Dg Ankle Complete Right  07/02/2014   CLINICAL DATA:  Status post reduction of complex ankle fractures.  EXAM: RIGHT ANKLE - COMPLETE 3+ VIEW  COMPARISON:  Earlier film, same date.  FINDINGS: No significant change in position or alignment of the fractures. Moderate posterior subluxation of the talus in relation to  the tibia.  IMPRESSION: Persistent displaced ankle fractures and posterior subluxation of the talus.   Electronically Signed   By: Rudie Meyer M.D.   On: 07/02/2014 21:25   Dg Ankle Complete Right  07/02/2014   CLINICAL DATA:  Larey Seat today and injured right ankle and foot.  EXAM: RIGHT ANKLE - COMPLETE 3+ VIEW; RIGHT FOOT COMPLETE - 3+ VIEW  COMPARISON:  None.  FINDINGS: Complex comminuted displaced trimalleolar fracture of the ankle. The distal fibular fracture is at and above the  ankle mortise with significant displacement. The medial malleolus is fractured at the level of the mortise and there is a suspected posterior tibia fracture. There is significant lateral subluxation of the talus in relation to the tibia but no frank dislocation.  No foot fractures are identified. Advanced degenerative changes noted at the first metatarsal phalangeal joint with hallux valgus deformity.  IMPRESSION: Comminuted and displaced ankle fractures as discussed above.   Electronically Signed   By: Rudie Meyer M.D.   On: 07/02/2014 17:54   Dg Foot Complete Right  07/02/2014   CLINICAL DATA:  Larey Seat today and injured right ankle and foot.  EXAM: RIGHT ANKLE - COMPLETE 3+ VIEW; RIGHT FOOT COMPLETE - 3+ VIEW  COMPARISON:  None.  FINDINGS: Complex comminuted displaced trimalleolar fracture of the ankle. The distal fibular fracture is at and above the ankle mortise with significant displacement. The medial malleolus is fractured at the level of the mortise and there is a suspected posterior tibia fracture. There is significant lateral subluxation of the talus in relation to the tibia but no frank dislocation.  No foot fractures are identified. Advanced degenerative changes noted at the first metatarsal phalangeal joint with hallux valgus deformity.  IMPRESSION: Comminuted and displaced ankle fractures as discussed above.   Electronically Signed   By: Rudie Meyer M.D.   On: 07/02/2014 17:54     EKG Interpretation None      MDM   Final diagnoses:  Trimalleolar fracture of ankle, closed, right, initial encounter   Patient with trimalleolar fracture of the ankle. Unable to reduce all the way back to anatomic alignment. Discussed with Dr. Charlann Boxer under patient's request. Maryclare Labrador transfer down to was in hospital for likely surgery tomorrow or the next day. I personally performed the services described in this documentation, which was scribed in my presence. The recorded information has been reviewed and is  accurate.       Juliet Rude. Rubin Payor, MD 07/03/14 [phone]

## 2014-07-03 ENCOUNTER — Encounter (HOSPITAL_COMMUNITY): Payer: Self-pay | Admitting: Anesthesiology

## 2014-07-03 ENCOUNTER — Inpatient Hospital Stay (HOSPITAL_COMMUNITY): Payer: Medicare Other

## 2014-07-03 ENCOUNTER — Inpatient Hospital Stay (HOSPITAL_COMMUNITY): Payer: Medicare Other | Admitting: Certified Registered Nurse Anesthetist

## 2014-07-03 ENCOUNTER — Encounter (HOSPITAL_COMMUNITY)
Admission: EM | Disposition: A | Discharge status: Skilled Nursing Facility | Payer: Self-pay | Source: Home / Self Care | Attending: Orthopedic Surgery

## 2014-07-03 DIAGNOSIS — S82891A Other fracture of right lower leg, initial encounter for closed fracture: Secondary | ICD-10-CM | POA: Diagnosis present

## 2014-07-03 HISTORY — PX: ANKLE CLOSED REDUCTION: SHX880

## 2014-07-03 LAB — SURGICAL PCR SCREEN
MRSA, PCR: NEGATIVE
STAPHYLOCOCCUS AUREUS: NEGATIVE

## 2014-07-03 SURGERY — CLOSED REDUCTION, ANKLE
Anesthesia: General | Site: Ankle | Laterality: Right

## 2014-07-03 MED ORDER — HYDROCODONE-ACETAMINOPHEN 5-325 MG PO TABS
1.0000 | ORAL_TABLET | Freq: Four times a day (QID) | ORAL | Status: DC | PRN
  Administered 2014-07-05 (×2): 1 via ORAL
  Administered 2014-07-06: 2 via ORAL
  Filled 2014-07-03: qty 1
  Filled 2014-07-03 (×2): qty 2

## 2014-07-03 MED ORDER — FENTANYL CITRATE 0.05 MG/ML IJ SOLN
25.0000 ug | INTRAMUSCULAR | Status: DC | PRN
Start: 2014-07-03 — End: 2014-07-03

## 2014-07-03 MED ORDER — ONDANSETRON HCL 4 MG PO TABS: 4.0000 mg | ORAL_TABLET | Freq: Four times a day (QID) | ORAL | Status: DC | PRN

## 2014-07-03 MED ORDER — PAROXETINE HCL 10 MG PO TABS
10.0000 mg | ORAL_TABLET | Freq: Every day | ORAL | Status: DC
  Administered 2014-07-03 – 2014-07-07 (×4): 10 mg via ORAL
  Filled 2014-07-03 (×5): qty 1

## 2014-07-03 MED ORDER — LIDOCAINE HCL (CARDIAC) 20 MG/ML IV SOLN
INTRAVENOUS | Status: AC
  Filled 2014-07-03: qty 5

## 2014-07-03 MED ORDER — SUCCINYLCHOLINE CHLORIDE 20 MG/ML IJ SOLN
INTRAMUSCULAR | Status: DC | PRN
  Administered 2014-07-03: 100 mg via INTRAVENOUS

## 2014-07-03 MED ORDER — FENTANYL CITRATE 0.05 MG/ML IJ SOLN: 50.0000 ug | INTRAMUSCULAR | Status: DC | PRN

## 2014-07-03 MED ORDER — LACTATED RINGERS IV SOLN
INTRAVENOUS | Status: DC
  Administered 2014-07-03: 1000 mL via INTRAVENOUS

## 2014-07-03 MED ORDER — FENTANYL CITRATE 0.05 MG/ML IJ SOLN
INTRAMUSCULAR | Status: AC
  Filled 2014-07-03: qty 2

## 2014-07-03 MED ORDER — CLINDAMYCIN PHOSPHATE 900 MG/50ML IV SOLN
INTRAVENOUS | Status: AC
  Filled 2014-07-03: qty 50

## 2014-07-03 MED ORDER — ENOXAPARIN SODIUM 40 MG/0.4ML ~~LOC~~ SOLN
40.0000 mg | SUBCUTANEOUS | Status: DC
Start: 2014-07-04 — End: 2014-07-05
  Administered 2014-07-04: 40 mg via SUBCUTANEOUS
  Filled 2014-07-03 (×3): qty 0.4

## 2014-07-03 MED ORDER — DOCUSATE SODIUM 100 MG PO CAPS
100.0000 mg | ORAL_CAPSULE | Freq: Two times a day (BID) | ORAL | Status: DC
  Administered 2014-07-03 – 2014-07-07 (×7): 100 mg via ORAL
  Filled 2014-07-03 (×9): qty 1

## 2014-07-03 MED ORDER — LIDOCAINE HCL (CARDIAC) 20 MG/ML IV SOLN
INTRAVENOUS | Status: DC | PRN
  Administered 2014-07-03: 100 mg via INTRAVENOUS

## 2014-07-03 MED ORDER — METOCLOPRAMIDE HCL 10 MG PO TABS: 5.0000 mg | ORAL_TABLET | Freq: Three times a day (TID) | ORAL | Status: DC | PRN

## 2014-07-03 MED ORDER — SODIUM CHLORIDE 0.9 % IV SOLN: INTRAVENOUS | Status: DC

## 2014-07-03 MED ORDER — ONDANSETRON HCL 4 MG/2ML IJ SOLN
INTRAMUSCULAR | Status: AC
  Filled 2014-07-03: qty 2

## 2014-07-03 MED ORDER — HYDROMORPHONE HCL 1 MG/ML IJ SOLN
0.5000 mg | INTRAMUSCULAR | Status: DC | PRN
  Administered 2014-07-03 (×4): 1 mg via INTRAVENOUS
  Filled 2014-07-03 (×4): qty 1

## 2014-07-03 MED ORDER — DEXAMETHASONE SODIUM PHOSPHATE 10 MG/ML IJ SOLN
INTRAMUSCULAR | Status: DC | PRN
  Administered 2014-07-03: 10 mg via INTRAVENOUS

## 2014-07-03 MED ORDER — ONDANSETRON HCL 4 MG/2ML IJ SOLN: 4.0000 mg | Freq: Four times a day (QID) | INTRAMUSCULAR | Status: DC | PRN

## 2014-07-03 MED ORDER — ONDANSETRON HCL 4 MG/2ML IJ SOLN
INTRAMUSCULAR | Status: DC | PRN
  Administered 2014-07-03: 4 mg via INTRAVENOUS

## 2014-07-03 MED ORDER — PROPOFOL 10 MG/ML IV BOLUS
INTRAVENOUS | Status: DC | PRN
  Administered 2014-07-03: 100 mg via INTRAVENOUS

## 2014-07-03 MED ORDER — SODIUM CHLORIDE 0.9 % IV SOLN
INTRAVENOUS | Status: DC
  Administered 2014-07-03 – 2014-07-04 (×3): via INTRAVENOUS

## 2014-07-03 MED ORDER — SODIUM CHLORIDE 0.9 % IJ SOLN
INTRAMUSCULAR | Status: AC
  Filled 2014-07-03: qty 10

## 2014-07-03 MED ORDER — MIDAZOLAM HCL 2 MG/2ML IJ SOLN
INTRAMUSCULAR | Status: AC
  Filled 2014-07-03: qty 2

## 2014-07-03 MED ORDER — PROPOFOL 10 MG/ML IV BOLUS
INTRAVENOUS | Status: AC
  Filled 2014-07-03: qty 20

## 2014-07-03 MED ORDER — DEXAMETHASONE SODIUM PHOSPHATE 10 MG/ML IJ SOLN
INTRAMUSCULAR | Status: AC
  Filled 2014-07-03: qty 1

## 2014-07-03 MED ORDER — DILTIAZEM HCL ER COATED BEADS 240 MG PO CP24
240.0000 mg | ORAL_CAPSULE | Freq: Every day | ORAL | Status: DC
  Administered 2014-07-03 – 2014-07-07 (×4): 240 mg via ORAL
  Filled 2014-07-03 (×5): qty 1

## 2014-07-03 MED ORDER — CLINDAMYCIN PHOSPHATE 900 MG/50ML IV SOLN
900.0000 mg | Freq: Once | INTRAVENOUS | Status: AC
Start: 2014-07-03 — End: 2014-07-03
  Administered 2014-07-03: 900 mg via INTRAVENOUS

## 2014-07-03 MED ORDER — METOCLOPRAMIDE HCL 5 MG/ML IJ SOLN: 5.0000 mg | Freq: Three times a day (TID) | INTRAMUSCULAR | Status: DC | PRN

## 2014-07-03 MED ORDER — ONDANSETRON HCL 4 MG/2ML IJ SOLN
INTRAMUSCULAR | Status: AC
Start: 2014-07-03 — End: 2014-07-03
  Filled 2014-07-03: qty 2

## 2014-07-03 MED ORDER — METHOCARBAMOL 1000 MG/10ML IJ SOLN
500.0000 mg | Freq: Four times a day (QID) | INTRAVENOUS | Status: DC | PRN
  Administered 2014-07-03: 500 mg via INTRAVENOUS
  Filled 2014-07-03 (×2): qty 5

## 2014-07-03 MED ORDER — PHENYLEPHRINE 40 MCG/ML (10ML) SYRINGE FOR IV PUSH (FOR BLOOD PRESSURE SUPPORT)
PREFILLED_SYRINGE | INTRAVENOUS | Status: AC
  Filled 2014-07-03: qty 10

## 2014-07-03 SURGICAL SUPPLY — 70 items
BAG SPEC THK2 15X12 ZIP CLS (MISCELLANEOUS)
BAG ZIPLOCK 12X15 (MISCELLANEOUS) ×2 IMPLANT
BANDAGE ELASTIC 4 VELCRO ST LF (GAUZE/BANDAGES/DRESSINGS) ×3 IMPLANT
BANDAGE ELASTIC 6 VELCRO ST LF (GAUZE/BANDAGES/DRESSINGS) ×3 IMPLANT
BANDAGE ESMARK 6X9 LF (GAUZE/BANDAGES/DRESSINGS) ×1 IMPLANT
BNDG ADH 5X4 AIR PERM ELC (GAUZE/BANDAGES/DRESSINGS) ×1
BNDG CMPR 9X6 STRL LF SNTH (GAUZE/BANDAGES/DRESSINGS)
BNDG CMPR MED 10X6 ELC LF (GAUZE/BANDAGES/DRESSINGS) ×1
BNDG COHESIVE 4X5 WHT NS (GAUZE/BANDAGES/DRESSINGS) ×2 IMPLANT
BNDG COHESIVE 6X5 TAN STRL LF (GAUZE/BANDAGES/DRESSINGS) ×1 IMPLANT
BNDG ELASTIC 6X10 VLCR STRL LF (GAUZE/BANDAGES/DRESSINGS) ×2 IMPLANT
BNDG ESMARK 6X9 LF (GAUZE/BANDAGES/DRESSINGS)
BNDG GAUZE ELAST 4 BULKY (GAUZE/BANDAGES/DRESSINGS) ×1 IMPLANT
CLEANER TIP ELECTROSURG 2X2 (MISCELLANEOUS) ×3 IMPLANT
CLOSURE WOUND 1/2 X4 (GAUZE/BANDAGES/DRESSINGS)
COVER SURGICAL LIGHT HANDLE (MISCELLANEOUS) ×1 IMPLANT
CUFF TOURN SGL QUICK 34 (TOURNIQUET CUFF)
CUFF TRNQT CYL 34X4X40X1 (TOURNIQUET CUFF) ×1 IMPLANT
DRAPE C-ARM 42X120 X-RAY (DRAPES) ×1 IMPLANT
DRAPE U-SHAPE 47X51 STRL (DRAPES) ×1 IMPLANT
DRSG ADAPTIC 3X8 NADH LF (GAUZE/BANDAGES/DRESSINGS) ×1 IMPLANT
DRSG PAD ABDOMINAL 8X10 ST (GAUZE/BANDAGES/DRESSINGS) ×1 IMPLANT
DURAPREP 26ML APPLICATOR (WOUND CARE) ×1 IMPLANT
ELECT REM PT RETURN 9FT ADLT (ELECTROSURGICAL)
ELECTRODE REM PT RTRN 9FT ADLT (ELECTROSURGICAL) ×1 IMPLANT
EVACUATOR 1/8 PVC DRAIN (DRAIN) ×1 IMPLANT
EVACUATOR SILICONE 100CC (DRAIN) ×1 IMPLANT
GAUZE SPONGE 4X4 12PLY STRL (GAUZE/BANDAGES/DRESSINGS) ×2 IMPLANT
GLOVE BIOGEL PI IND STRL 7.5 (GLOVE) ×1 IMPLANT
GLOVE BIOGEL PI IND STRL 8.5 (GLOVE) ×1 IMPLANT
GLOVE BIOGEL PI INDICATOR 7.5 (GLOVE)
GLOVE BIOGEL PI INDICATOR 8.5 (GLOVE)
GLOVE ECLIPSE 8.0 STRL XLNG CF (GLOVE) IMPLANT
GLOVE ORTHO TXT STRL SZ7.5 (GLOVE) ×2 IMPLANT
GLOVE SURG ORTHO 8.0 STRL STRW (GLOVE) ×1 IMPLANT
GOWN SPEC L3 XXLG W/TWL (GOWN DISPOSABLE) ×2 IMPLANT
GOWN STRL REUS W/TWL LRG LVL3 (GOWN DISPOSABLE) ×1 IMPLANT
KIT BASIN OR (CUSTOM PROCEDURE TRAY) ×1 IMPLANT
MANIFOLD NEPTUNE II (INSTRUMENTS) ×1 IMPLANT
NEEDLE HYPO 22GX1.5 SAFETY (NEEDLE) ×1 IMPLANT
NS IRRIG 1000ML POUR BTL (IV SOLUTION) ×1 IMPLANT
PACK ORTHO EXTREMITY (CUSTOM PROCEDURE TRAY) ×1 IMPLANT
PACK TOTAL JOINT (CUSTOM PROCEDURE TRAY) ×1 IMPLANT
PAD CAST 4YDX4 CTTN HI CHSV (CAST SUPPLIES) ×2 IMPLANT
PADDING CAST ABS 4INX4YD NS (CAST SUPPLIES) ×2
PADDING CAST ABS 6INX4YD NS (CAST SUPPLIES) ×2
PADDING CAST ABS COTTON 4X4 ST (CAST SUPPLIES) IMPLANT
PADDING CAST ABS COTTON 6X4 NS (CAST SUPPLIES) IMPLANT
PADDING CAST COTTON 4X4 STRL (CAST SUPPLIES) ×9
PADDING CAST COTTON 6X4 STRL (CAST SUPPLIES) ×3 IMPLANT
POSITIONER SURGICAL ARM (MISCELLANEOUS) ×1 IMPLANT
SOL PREP POV-IOD 4OZ 10% (MISCELLANEOUS) ×1 IMPLANT
SOL PREP PROV IODINE SCRUB 4OZ (MISCELLANEOUS) ×1 IMPLANT
SPLINT PLASTER CAST XFAST 5X30 (CAST SUPPLIES) IMPLANT
SPLINT PLASTER XFAST SET 5X30 (CAST SUPPLIES) ×40
SPONGE LAP 18X18 X RAY DECT (DISPOSABLE) ×1 IMPLANT
STAPLER VISISTAT 35W (STAPLE) ×1 IMPLANT
STOCKINETTE 8 INCH (MISCELLANEOUS) ×1 IMPLANT
STRIP CLOSURE SKIN 1/2X4 (GAUZE/BANDAGES/DRESSINGS) ×1 IMPLANT
SUCTION FRAZIER TIP 10 FR DISP (SUCTIONS) ×1 IMPLANT
SUT ETHILON 3 0 PS 1 (SUTURE) ×1 IMPLANT
SUT MNCRL AB 4-0 PS2 18 (SUTURE) ×1 IMPLANT
SUT VIC AB 1 CT1 27 (SUTURE)
SUT VIC AB 1 CT1 27XBRD ANTBC (SUTURE) ×2 IMPLANT
SUT VIC AB 2-0 CT1 27 (SUTURE)
SUT VIC AB 2-0 CT1 TAPERPNT 27 (SUTURE) ×2 IMPLANT
SYR CONTROL 10ML LL (SYRINGE) ×1 IMPLANT
TOWEL OR 17X26 10 PK STRL BLUE (TOWEL DISPOSABLE) ×6 IMPLANT
WATER STERILE IRR 1500ML POUR (IV SOLUTION) ×3 IMPLANT
YANKAUER SUCT BULB TIP 10FT TU (MISCELLANEOUS) ×3 IMPLANT

## 2014-07-03 NOTE — Transfer of Care (Signed)
Immediate Anesthesia Transfer of Care Note  Patient: [name]  Procedure(s) Performed: Procedure(s): CLOSED  REDUCTION ANKLE  WITH CAST APPLICATION (Right)  Patient Location: PACU  Anesthesia Type:General  Level of Consciousness: sedated  Airway & Oxygen Therapy: Patient Spontanous Breathing and Patient connected to face mask oxygen  Post-op Assessment: Report given to RN and Post -op Vital signs reviewed and stable  Post vital signs: Reviewed and stable  Last Vitals:  Filed Vitals:   07/03/14 1417  BP: 146/77  Pulse: 89  Temp: 36.9 C  Resp: 17    Complications: No apparent anesthesia complications

## 2014-07-03 NOTE — Anesthesia Postprocedure Evaluation (Signed)
  Anesthesia Post-op Note  Patient: [name]  Procedure(s) Performed: Procedure(s) (LRB): CLOSED  REDUCTION ANKLE  WITH CAST APPLICATION (Right)  Patient Location: PACU  Anesthesia Type: General  Level of Consciousness: awake and alert   Airway and Oxygen Therapy: Patient Spontanous Breathing  Post-op Pain: mild  Post-op Assessment: Post-op Vital signs reviewed, Patient's Cardiovascular Status Stable, Respiratory Function Stable, Patent Airway and No signs of Nausea or vomiting  Last Vitals:  Filed Vitals:   07/03/14 1915  BP: 141/54  Pulse: 81  Temp: 36.6 C  Resp: 15    Post-op Vital Signs: stable   Complications: No apparent anesthesia complications

## 2014-07-03 NOTE — Interval H&P Note (Signed)
History and Physical Interval Note:  07/03/2014 6:07 PM  [name]  has presented today for surgery, with the diagnosis of right ankle fracture  The various methods of treatment have been discussed with the patient and family. After consideration of risks, benefits and other options for treatment, the patient has consented to  Procedure(s): CLOSED VERSUS OPEN REDUCTION ANKLE  (Right) EXTERNAL FIXATION LEG (Right) as a surgical intervention .  The patient's history has been reviewed, patient examined, no change in status, stable for surgery.  I have reviewed the patient's chart and labs.  Questions were answered to the patient's satisfaction.     Shelda PalLIN,Ayuub Penley D

## 2014-07-03 NOTE — H&P (Signed)
[name] is an 81 y.o. female.    Chief Complaint:   Right ankle pain  HPI:   [name] is a 81 y.o. female with a history of HTN who presents to the Emergency Department complaining of right ankle pain that occurred PTA. Pt says she went to step up into her utility room and as she was putting her foot down her heel caught and she admits to hearing a cracking / popping sound. She describes the pain as burning and states that it worsens with movement. She denies any head trauma or any other injuries.  Pt reports no pertinent past medical history at this time. She states that she has not had issues with anesthesia in the past.  Unable to reduce all the way back to anatomic alignment in the ED.  Patient was tranfered. Have discussed that she will need surgery to repair the fracture. Risks, benefits and expectations were discussed with the patient.  Risks including but not limited to the risk of anesthesia, blood clots, nerve damage, blood vessel damage, failure of the prosthesis, infection and up to and including death.  Patient understand the risks, benefits and expectations and wishes to proceed with surgery.     Past Medical History  Diagnosis Date  . Hypertension   . Irregular heart beat   . Depression   . Dupuytren's contracture of both hands   . Kidney stone     Past Surgical History  Procedure Laterality Date  . Abdominal hysterectomy    . Dupuytren contracture release      left hand  . Laparoscopic cholecystectomy  03/19/12    Lovell Sheehan, +IOC  . Cholecystectomy  03/19/2012    Procedure: LAPAROSCOPIC CHOLECYSTECTOMY WITH INTRAOPERATIVE CHOLANGIOGRAM;  Surgeon: Dalia Heading, MD;  Location: [facility];  Service: General;  Laterality: N/A;  with common duct exploration  . Ercp N/A 03/21/2012    Procedure: ENDOSCOPIC RETROGRADE CHOLANGIOPANCREATOGRAPHY (ERCP);  Surgeon: Corbin Ade, MD;  Location: [facility];  Service: Endoscopy;  Laterality: N/A;  . Sphincterotomy N/A 03/21/2012     Procedure: SPHINCTEROTOMY;  Surgeon: Corbin Ade, MD;  Location: [facility];  Service: Endoscopy;  Laterality: N/A;  . Balloon dilation N/A 03/21/2012    Procedure: BALLOON DILATION;  Surgeon: Corbin Ade, MD;  Location: [facility];  Service: Endoscopy;  Laterality: N/A;  Balloon Stone Extraction  . Removal of stones N/A 03/21/2012    Procedure: REMOVAL OF STONES;  Surgeon: Corbin Ade, MD;  Location: [facility];  Service: Endoscopy;  Laterality: N/A;    History reviewed. No pertinent family history. Social History:  reports that she has never smoked. She has never used smokeless tobacco. She reports that she does not drink alcohol or use illicit drugs.  Allergies:  Allergies  Allergen Reactions  . Penicillins Other (See Comments)    Unknown reaction from years ago  . Procaine Hcl Palpitations    Medications Prior to Admission  Medication Sig Dispense Refill  . aspirin EC 81 MG tablet Take 81 mg by mouth daily.    Marland Kitchen diltiazem (CARDIZEM CD) 240 MG 24 hr capsule Take 1 capsule by mouth Daily.    Marland Kitchen lisinopril (PRINIVIL,ZESTRIL) 40 MG tablet Take 40 mg by mouth daily.    Marland Kitchen PARoxetine (PAXIL) 20 MG tablet Take 10 mg by mouth daily.    Bertram Gala Glycol-Propyl Glycol (SYSTANE) 0.4-0.3 % SOLN Place 1 drop into both eyes daily as needed. For dry eyes    . promethazine (  PHENERGAN) 25 MG tablet Take 1 tablet (25 mg total) by mouth every 6 (six) hours as needed. (Patient not taking: Reported on 07/02/2014) 15 tablet 0  . sulfamethoxazole-trimethoprim (BACTRIM DS) 800-160 MG per tablet Take 1 tablet by mouth 2 (two) times daily. (Patient not taking: Reported on 07/02/2014) 10 tablet 0  . tamsulosin (FLOMAX) 0.4 MG CAPS capsule Take 1 capsule (0.4 mg total) by mouth daily. (Patient not taking: Reported on 07/02/2014) 15 capsule 0    No results found for this or any previous visit (from the past 48 hour(s)). Dg Ankle Complete Right  07/02/2014   CLINICAL DATA:  Status post reduction of complex ankle  fractures.  EXAM: RIGHT ANKLE - COMPLETE 3+ VIEW  COMPARISON:  Earlier film, same date.  FINDINGS: No significant change in position or alignment of the fractures. Moderate posterior subluxation of the talus in relation to the tibia.  IMPRESSION: Persistent displaced ankle fractures and posterior subluxation of the talus.   Electronically Signed   By: Rudie Meyer M.D.   On: 07/02/2014 21:25   Dg Ankle Complete Right  07/02/2014   CLINICAL DATA:  Larey Seat today and injured right ankle and foot.  EXAM: RIGHT ANKLE - COMPLETE 3+ VIEW; RIGHT FOOT COMPLETE - 3+ VIEW  COMPARISON:  None.  FINDINGS: Complex comminuted displaced trimalleolar fracture of the ankle. The distal fibular fracture is at and above the ankle mortise with significant displacement. The medial malleolus is fractured at the level of the mortise and there is a suspected posterior tibia fracture. There is significant lateral subluxation of the talus in relation to the tibia but no frank dislocation.  No foot fractures are identified. Advanced degenerative changes noted at the first metatarsal phalangeal joint with hallux valgus deformity.  IMPRESSION: Comminuted and displaced ankle fractures as discussed above.   Electronically Signed   By: Rudie Meyer M.D.   On: 07/02/2014 17:54   Dg Foot Complete Right  07/02/2014   CLINICAL DATA:  Larey Seat today and injured right ankle and foot.  EXAM: RIGHT ANKLE - COMPLETE 3+ VIEW; RIGHT FOOT COMPLETE - 3+ VIEW  COMPARISON:  None.  FINDINGS: Complex comminuted displaced trimalleolar fracture of the ankle. The distal fibular fracture is at and above the ankle mortise with significant displacement. The medial malleolus is fractured at the level of the mortise and there is a suspected posterior tibia fracture. There is significant lateral subluxation of the talus in relation to the tibia but no frank dislocation.  No foot fractures are identified. Advanced degenerative changes noted at the first metatarsal phalangeal  joint with hallux valgus deformity.  IMPRESSION: Comminuted and displaced ankle fractures as discussed above.   Electronically Signed   By: Rudie Meyer M.D.   On: 07/02/2014 17:54    Review of Systems  Constitutional: Negative.   HENT: Negative.   Eyes: Negative.   Respiratory: Negative.   Cardiovascular: Negative.   Gastrointestinal: Negative.   Genitourinary: Negative.   Musculoskeletal: Positive for joint pain.  Skin: Negative.   Neurological: Negative.   Endo/Heme/Allergies: Negative.   Psychiatric/Behavioral: Positive for depression.    Blood pressure 151/63, pulse 92, temperature 98.1 F (36.7 C), temperature source Oral, resp. rate 18, height  (1.651 m), weight 61.236 kg (135 lb), SpO2 95 %. Physical Exam  Constitutional: She is oriented to person, place, and time. She appears well-developed and well-nourished.  HENT:  Head: Normocephalic and atraumatic.  Eyes: Pupils are equal, round, and reactive to light.  Neck: Neck supple.  No JVD present. No tracheal deviation present. No thyromegaly present.  Cardiovascular: Normal rate and intact distal pulses.   Respiratory: Effort normal and breath sounds normal. No respiratory distress. She has no wheezes.  GI: Soft. There is no tenderness. There is no guarding.  Musculoskeletal:       Right ankle: She exhibits decreased range of motion and swelling. Tenderness. Lateral malleolus and medial malleolus tenderness found.  Lymphadenopathy:    She has no cervical adenopathy.  Neurological: She is alert and oriented to person, place, and time.  Skin: Skin is warm and dry.  Psychiatric: She has a normal mood and affect.     Assessment/Plan Comminuted displaced trimalleolar fracture of the right ankle    NPO now NWB right leg Elevation and ice to the right ankle Splint already placed on the right lower leg. Dr. Charlann Boxerlin will have a discussion with Dr. Victorino DikeHewitt regarding the patent and potential care Plan for potential surgery  later today, will advise otherwise if plans change.     Gerrit HallsBabish, Garyn Waguespack Scott 07/03/2014, 8:15 AM

## 2014-07-03 NOTE — Anesthesia Preprocedure Evaluation (Signed)
Anesthesia Evaluation  Patient identified by MRN, date of birth, ID band Patient awake    Reviewed: Allergy & Precautions, NPO status , Patient's Chart, lab work & pertinent test results  Airway Mallampati: II  TM Distance: >3 FB Neck ROM: Full    Dental no notable dental hx.    Pulmonary neg pulmonary ROS,  breath sounds clear to auscultation  Pulmonary exam normal       Cardiovascular hypertension, Pt. on medications Rhythm:Regular Rate:Normal     Neuro/Psych PSYCHIATRIC DISORDERS Depression negative neurological ROS     GI/Hepatic negative GI ROS, Neg liver ROS,   Endo/Other  negative endocrine ROS  Renal/GU Renal disease  negative genitourinary   Musculoskeletal negative musculoskeletal ROS (+)   Abdominal   Peds negative pediatric ROS (+)  Hematology negative hematology ROS (+)   Anesthesia Other Findings   Reproductive/Obstetrics negative OB ROS                             Anesthesia Physical Anesthesia Plan  ASA: III  Anesthesia Plan: General   Post-op Pain Management:    Induction: Intravenous  Airway Management Planned: Oral ETT  Additional Equipment:   Intra-op Plan:   Post-operative Plan: Extubation in OR  Informed Consent: I have reviewed the patients History and Physical, chart, labs and discussed the procedure including the risks, benefits and alternatives for the proposed anesthesia with the patient or authorized representative who has indicated his/her understanding and acceptance.   Dental advisory given  Plan Discussed with: CRNA  Anesthesia Plan Comments:         Anesthesia Quick Evaluation

## 2014-07-03 NOTE — Brief Op Note (Signed)
07/02/2014 - 07/03/2014  6:07 PM  PATIENT:  [name]  81 y.o. female  PRE-OPERATIVE DIAGNOSIS:  Closed right trimalleolar ankle fracture/subluxation  POST-OPERATIVE DIAGNOSIS:  Closed right trimalleolar ankle fracture/subluxation  PROCEDURE:  Procedure(s):  Closed reduction of right ankle fracture, application of short leg splint   SURGEON:  Surgeon(s) and Role:    * Shelda PalMatthew D Aikam Vinje, MD - Primary  PHYSICIAN ASSISTANT: None  ANESTHESIA:   general  EBL:  Total I/O In: 0  Out: 350 [Urine:350]  BLOOD ADMINISTERED:none  DRAINS: none   LOCAL MEDICATIONS USED:  NONE  SPECIMEN:  No Specimen  DISPOSITION OF SPECIMEN:  N/A  COUNTS:  YES  TOURNIQUET:    DICTATION: .Other Dictation: Dictation Number [id number]  PLAN OF CARE: Admit to inpatient   PATIENT DISPOSITION:  PACU - hemodynamically stable.   Delay start of Pharmacological VTE agent (>24hrs) due to surgical blood loss or risk of bleeding: no

## 2014-07-04 ENCOUNTER — Inpatient Hospital Stay (HOSPITAL_COMMUNITY): Payer: Medicare Other

## 2014-07-04 ENCOUNTER — Encounter (HOSPITAL_COMMUNITY): Payer: Self-pay | Admitting: Orthopedic Surgery

## 2014-07-04 MED ORDER — WHITE PETROLATUM GEL
Status: AC
  Administered 2014-07-04: 1
  Filled 2014-07-04: qty 1

## 2014-07-04 NOTE — Progress Notes (Signed)
[facility] to transport patient to [facility], [facility].   Patients VSS.

## 2014-07-04 NOTE — Progress Notes (Signed)
Clinical Social Work Department BRIEF PSYCHOSOCIAL ASSESSMENT 07/04/2014  Patient:  [name], [name]     Account Number:  [id number]     Admit date:  07/02/2014  Clinical Social Worker:  Lacie Scotts  Date/Time:  07/04/2014 12:33 PM  Referred by:  Physician  Date Referred:  07/04/2014 Referred for  SNF Placement   Other Referral:   Interview type:  Patient Other interview type:    PSYCHOSOCIAL DATA Living Status:  ALONE Admitted from facility:   Level of care:   Primary support name:  Annitta Needs Primary support relationship to patient:  CHILD, ADULT Degree of support available:   supportive    CURRENT CONCERNS Current Concerns  Post-Acute Placement   Other Concerns:    SOCIAL WORK ASSESSMENT / PLAN Pt is an 81 yr old female living at home prior to hoapitalization. Pt has an ankle fx and had surgey 2/11. Additional surgery is pending. MD / PT has recommended ST rehab following hospital d/c. CSW has met with pt / son to offer assistance with d/c planning. Pt / family are in agreement with plan for rehab and have requested [facility]. SNF has been contacted and bed offer received. CSW will continue to follow to assist with d/c planning to SNF.   Assessment/plan status:  Psychosocial Support/Ongoing Assessment of Needs Other assessment/ plan:   Information/referral to community resources:   Insurance coverage for SNF reviewed.    PATIENT'S/FAMILY'S RESPONSE TO PLAN OF CARE: Pt understands that rehab is needed following hospitalization, though she prefers to return home. " If I have to go somewhere I want to go to [facility] [place]. " Pt worries about getting along with a roomate.  Pt is anious regarding placement but does accept reassurce from family / [place].    Werner Lean LCSW [phone]

## 2014-07-04 NOTE — Op Note (Signed)
[name], [name]               ACCOUNT NO.:  [id number]  MEDICAL RECORD NO.:  [id number]  LOCATION:  1608                         FACILITY:  [facility]  PHYSICIAN:  Madlyn Frankel. Charlann Boxer, M.D.  DATE OF BIRTH:  07/15/1925  DATE OF PROCEDURE:  07/03/2014 DATE OF DISCHARGE:                              OPERATIVE REPORT   PREOPERATIVE DIAGNOSIS:  Closed right trimalleolar ankle fracture with subluxation and incomplete reduction.  POSTOPERATIVE DIAGNOSIS:  Closed right trimalleolar ankle fracture with subluxation and incomplete reduction.  PROCEDURE:  Closed reduction of right ankle fracture, application of short-leg splint under fluoroscopic imaging, stress radiography.  SURGEON:  Madlyn Frankel. Charlann Boxer, M.D.  ASSISTANT:  Surgical team.  ANESTHESIA:  General LMA.  SPECIMEN:  None.  COMPLICATIONS:  None.  INDICATIONS FOR PROCEDURE:  [name] is an 81 year old female, very active, living independently.  She unfortunately had a twisting mechanism to Sharon right ankle and heard and felt a popping sensation and inability to bear weight.  She was taken to the emergency room in [facility] where attempted reduction with emergency physicians failed back get adequate reduction.  She was transferred down to [facility] last night. Radiographs were reviewed.  Based on the reports of inability to reduce ankle and maintain stability, there was some concern of entrapment of muscles and tendon versus instability ankle.  I reviewed with [name] After discussion with Dr. Toni Arthurs about the potential plan for definitive management to take Sharon to the operating room and an attempt closed reduction versus open reduction if there was entrapment of the tendons versus application of an external fixator.  The risks, benefits, and assessing the procedure were discussed as it relates to relaxing the soft tissues and allowing them to heal for adequate definitive fixation sometime soon.  Consent was  obtained.  DESCRIPTION OF PROCEDURE:  The patient was brought to operative theater. Antibiotics administered.  Once adequate anesthesia was established, she was positioned supine.  Sharon old splint was removed.  A time-out was performed identifying the patient, planned procedure, and extremity.  I was able to then very easily reduce the ankle, I could get a sense of the instability, but held into a reduced fashion obtaining radiographs. I identified the reduction there .  The maneuvers required for reduction which included slight inversion of the ankle with dorsiflexion to lock into place.  With this maneuver appreciated, I then applied a soft cast padding followed by 2 plaster splints, an L and U.  While the splint material was setting up on maintaining the reduced maneuver and confirmed radiographically that the fracture remained anatomically reduced.  Once the casting material had fully secured and was hardened, she was woken from anesthesia and brought to the recovery room in stable condition.  Upright, we will order a CT scan on Sharon ankle to evaluate for any distal tibia deformity that may benefit Dr. Victorino Dike for definitive surgical planning, should be nonweightbearing on the right ankle.  We will work on placement.  I will discuss with Dr. Victorino Dike the findings tomorrow and will work on finding the definitive plan for him to operate on Sharon next Tuesday or Thursday.  Findings were reviewed with Sharon  [name].  She tolerated the procedure well without complications.     Madlyn FrankelMatthew D. Charlann Boxerlin, M.D.     MDO/MEDQ  D:  07/03/2014  T:  07/04/2014  Job:  [id number]

## 2014-07-04 NOTE — Progress Notes (Signed)
Patient ID: Sharon RaringClarice P [name], female   DOB: 1925-08-03, 81 y.o.   MRN: [id number] Subjective: 1 Day Post-Op Procedure(s) (LRB): CLOSED  REDUCTION ANKLE  WITH CAST APPLICATION (Right)    Patient reports pain as mild.  Objective:   VITALS:   Filed Vitals:   07/04/14 0521  BP: 135/73  Pulse: 81  Temp: 98 F (36.7 C)  Resp: 16    Neurovascular intact Incision: right LLE splint C/D/I  LABS No results for input(s): HGB, HCT, WBC, PLT in the last 72 hours.  No results for input(s): NA, K, BUN, CREATININE, GLUCOSE in the last 72 hours.  No results for input(s): LABPT, INR in the last 72 hours.   Assessment/Plan: 1 Day Post-Op Procedure(s) (LRB): CLOSED  REDUCTION ANKLE  WITH CAST APPLICATION (Right)   Up with therapy - NWB RLE Elevate right leg to help with swelling  Discharge home with home health versus SNF

## 2014-07-04 NOTE — Progress Notes (Signed)
Pt plans to trans to [facility] today. Signed FL2 is in pt's chart.  It's unclear when pt will be ready for d/c.  [facility] bed from [facility]. Pt / family are aware of plan. [facility] , will assist with d/c planning.  Cori RazorJamie Dajanee Voorheis LCSW [phone]

## 2014-07-04 NOTE — Evaluation (Signed)
Physical Therapy Evaluation Patient Details Name: [name] MRN: [id number] DOB: 02-Jan-1926 Today's Date: 07/04/2014   History of Present Illness  81 yo female adm after fall resulting in trimalleolar R ankle fx; s/p closed reduciton R ankle  Clinical Impression  Pt admitted with above diagnosis. Pt currently with functional limitations due to the deficits listed below (see PT Problem List).  Pt will benefit from skilled PT to increase their independence and safety with mobility to allow discharge to the venue listed below.  Recommend SNF     Follow Up Recommendations SNF;Supervision/Assistance - 24 hour    Equipment Recommendations  Rolling walker with 5" wheels    Recommendations for Other Services       Precautions / Restrictions Precautions Precautions: Fall Restrictions Weight Bearing Restrictions: Yes RLE Weight Bearing: Non weight bearing      Mobility  Bed Mobility Overal bed mobility: Needs Assistance Bed Mobility: Supine to Sit     Supine to sit: Min guard;Supervision     General bed mobility comments: incr time and cues to self assist safely  Transfers Overall transfer level: Needs assistance Equipment used: Rolling walker (2 wheeled) Transfers: Sit to/from [employer] Sit to Stand: Min assist Stand pivot transfers: Min assist;Mod assist       General transfer comment: cues for hand placement, sequence, use of UEs to maintain NWB, safety; assist throughout for balance and maneuvering RW  Ambulation/Gait Ambulation/Gait assistance: Min assist Ambulation Distance (Feet): 22 Feet Assistive device: Rolling walker (2 wheeled)       General Gait Details: multi-modal cues for sequence,  NWB, RW safety and position; pt is easily distracted and requires frequent redirection to task  Stairs            Wheelchair Mobility    Modified Rankin (Stroke Patients Only)       Balance Overall balance assessment: Needs  assistance;History of Falls           Standing balance-Leahy Scale: Poor                               Pertinent Vitals/Pain Pain Assessment: 0-10 Pain Score: 1  Pain Location: R ankle Pain Descriptors / Indicators: Tingling Pain Intervention(s): Limited activity within patient's tolerance;Monitored during session;Repositioned    Home Living Family/patient expects to be discharged to:: Private residence     Type of Home: House Home Access: Stairs to enter   Secretary/administrator of Steps: 3 Home Layout: One level Home Equipment: None Additional Comments: 2 sons live close by, not available to stay with her 24/7;      Prior Function Level of Independence: Independent         Comments: "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     Higher education careers adviser        Extremity/Trunk Assessment   Upper Extremity Assessment: Defer to OT evaluation           Lower Extremity Assessment: RLE deficits/detail RLE Deficits / Details: AAROM knee and hip grossly WFL; hip strength 3/5       Communication   Communication: No difficulties  Cognition Arousal/Alertness: Awake/alert Behavior During Therapy: WFL for tasks assessed/performed   Area of Impairment: Attention;Safety/judgement;Problem solving   Current Attention Level: Sustained     Safety/Judgement: Decreased awareness of safety;Decreased awareness of deficits Pt is impulsive   Problem Solving: Difficulty sequencing;Requires verbal cues;Requires tactile cues General Comments: requires frequent redirection to task,  very pleasant but talks a great deal    General Comments      Exercises        Assessment/Plan    PT Assessment Patient needs continued PT services  PT Diagnosis Difficulty walking   PT Problem List Decreased activity tolerance;Decreased balance;Decreased mobility;Decreased knowledge of use of DME;Decreased knowledge of precautions  PT Treatment Interventions DME instruction;Gait  training;Stair training;Functional mobility training;Therapeutic activities;Patient/family education;Therapeutic exercise   PT Goals (Current goals can be found in the Care Plan section) Acute Rehab PT Goals Patient Stated Goal: return to PLOF PT Goal Formulation: With patient Time For Goal Achievement: 07/11/14 Potential to Achieve Goals: Good    Frequency Min 6X/week   Barriers to discharge        Co-evaluation               End of Session Equipment Utilized During Treatment: Gait belt Activity Tolerance: Patient tolerated treatment well Patient left: with call bell/phone within reach;in chair Nurse Communication: Mobility status         Time: [id number] PT Time Calculation (min) (ACUTE ONLY): 24 min   Charges:   PT Evaluation $Initial PT Evaluation Tier I: 1 Procedure PT Treatments $Gait Training: 8-22 mins   PT G Codes:        Aanshi Batchelder 07/04/2014, 10:38 AM

## 2014-07-04 NOTE — Progress Notes (Addendum)
Clinical Social Work Department CLINICAL SOCIAL WORK PLACEMENT NOTE 07/04/2014  Patient:  [name],[name]  Account Number:  [id number] Admit date:  07/02/2014  Clinical Social Worker:  Cori RazorJAMIE HAIDINGER, LCSW  Date/time:  07/04/2014 12:44 PM  Clinical Social Work is seeking post-discharge placement for this patient at the following level of care:   SKILLED NURSING   (*CSW will update this form in Epic as items are completed)     Patient/family provided with [facility] Department of Clinical Social Work's list of facilities offering this level of care within the geographic area requested by the patient (or if unable, by the patient's family).  07/04/2014  Patient/family informed of their freedom to choose among providers that offer the needed level of care, that participate in Medicare, Medicaid or managed care program needed by the patient, have an available bed and are willing to accept the patient.  07/04/2014  Patient/family informed of [facility]' ownership interest in [facility], as well as of the fact that they are under no obligation to receive care at this facility.  PASARR submitted to EDS on 07/04/2014 PASARR number received on 07/04/2014  FL2 transmitted to all facilities in geographic area requested by pt/family on  07/04/2014 FL2 transmitted to all facilities within larger geographic area on   Patient informed that his/her managed care company has contracts with or will negotiate with  certain facilities, including the following:     Patient/family informed of bed offers received:  07/04/2014 [facility] Physician recommends and patient chooses bed at    Patient to be transferred to  [facility] on 07/07/2014 Marcelline Deist(Marlise Fahr, [place])  Patient to be transferred to facility by [facility] Marcelline Deist(Mavrik Bynum, LCSWA) Patient and family notified of transfer on 07/07/2014 Marcelline Deist(Michalla Ringer, Theresia MajorsLCSWA) Name of family member notified:  Patient  updated at bedside Lily Kocher(Benecio Kluger, LCSWA)  The following physician request were entered in Epic:   Additional Comments: [facility]  [facility] JerseyJamie Haidinger LCSW [phone]  Marcelline Deistmily Brenlynn Fake, LCSWA [phone]) Licensed Clinical Social Worker Orthopedics [phone]([id number]) and Surgical [phone]([id number])

## 2014-07-04 NOTE — Care Management Note (Signed)
    Page 1 of 1   07/04/2014     12:33:41 PM CARE MANAGEMENT NOTE 07/04/2014  Patient:  [name],[name]   Account Number:  [id number]  Date Initiated:  07/04/2014  Documentation initiated by:  Lanier ClamMAHABIR,Tyashia Morrisette  Subjective/Objective Assessment:   81 y/o f admitted w/R ankle fx.     Action/Plan:   From home.   Anticipated DC Date:  07/05/2014   Anticipated DC Plan:  SKILLED NURSING FACILITY      DC Planning Services  CM consult      Choice offered to / List presented to:             Status of service:  In process, will continue to follow Medicare Important Message given?   (If response is "NO", the following Medicare IM given date fields will be blank) Date Medicare IM given:   Medicare IM given by:   Date Additional Medicare IM given:   Additional Medicare IM given by:    Discharge Disposition:    Per UR Regulation:  Reviewed for med. necessity/level of care/duration of stay  If discussed at Long Length of Stay Meetings, dates discussed:    Comments:  07/04/14 Lanier ClamKathy Maurie Olesen RN BSN NCM [phone] POD#1 R ankle closed reduction.PT-SNf.CSW already aware & following.

## 2014-07-05 ENCOUNTER — Inpatient Hospital Stay (HOSPITAL_COMMUNITY): Payer: Medicare Other | Admitting: Certified Registered"

## 2014-07-05 ENCOUNTER — Encounter (HOSPITAL_COMMUNITY)
Admission: EM | Disposition: A | Discharge status: Skilled Nursing Facility | Payer: Self-pay | Source: Home / Self Care | Attending: Orthopedic Surgery

## 2014-07-05 HISTORY — PX: ORIF ANKLE FRACTURE: SHX5408

## 2014-07-05 LAB — POCT I-STAT, CHEM 8
BUN: 17 mg/dL (ref 6–23)
CALCIUM ION: 1.06 mmol/L — AB (ref 1.13–1.30)
CHLORIDE: 105 mmol/L (ref 96–112)
CREATININE: 0.6 mg/dL (ref 0.50–1.10)
Glucose, Bld: 106 mg/dL — ABNORMAL HIGH (ref 70–99)
HCT: 36 % (ref 36.0–46.0)
HEMOGLOBIN: 12.2 g/dL (ref 12.0–15.0)
POTASSIUM: 3.7 mmol/L (ref 3.5–5.1)
Sodium: 140 mmol/L (ref 135–145)
TCO2: 22 mmol/L (ref 0–100)

## 2014-07-05 SURGERY — OPEN REDUCTION INTERNAL FIXATION (ORIF) ANKLE FRACTURE
Anesthesia: Regional | Laterality: Right

## 2014-07-05 MED ORDER — OXYCODONE HCL 5 MG/5ML PO SOLN: 5.0000 mg | Freq: Once | ORAL | Status: DC | PRN

## 2014-07-05 MED ORDER — FENTANYL CITRATE 0.05 MG/ML IJ SOLN
INTRAMUSCULAR | Status: AC
  Filled 2014-07-05: qty 5

## 2014-07-05 MED ORDER — ONDANSETRON HCL 4 MG/2ML IJ SOLN: 4.0000 mg | Freq: Once | INTRAMUSCULAR | Status: DC | PRN

## 2014-07-05 MED ORDER — PROPOFOL 10 MG/ML IV BOLUS
INTRAVENOUS | Status: AC
  Filled 2014-07-05: qty 20

## 2014-07-05 MED ORDER — ARTIFICIAL TEARS OP OINT
TOPICAL_OINTMENT | OPHTHALMIC | Status: DC | PRN
  Administered 2014-07-05: 1 via OPHTHALMIC

## 2014-07-05 MED ORDER — BUPIVACAINE-EPINEPHRINE (PF) 0.5% -1:200000 IJ SOLN
INTRAMUSCULAR | Status: DC | PRN
  Administered 2014-07-05: 15 mL via PERINEURAL
  Administered 2014-07-05: 25 mL via PERINEURAL

## 2014-07-05 MED ORDER — SODIUM CHLORIDE 0.9 % IV SOLN: INTRAVENOUS | Status: DC

## 2014-07-05 MED ORDER — ONDANSETRON HCL 4 MG/2ML IJ SOLN
INTRAMUSCULAR | Status: AC
  Filled 2014-07-05: qty 2

## 2014-07-05 MED ORDER — 0.9 % SODIUM CHLORIDE (POUR BTL) OPTIME
TOPICAL | Status: DC | PRN
  Administered 2014-07-05: 1000 mL

## 2014-07-05 MED ORDER — SENNA 8.6 MG PO TABS
1.0000 | ORAL_TABLET | Freq: Two times a day (BID) | ORAL | Status: DC
  Administered 2014-07-05 – 2014-07-07 (×5): 8.6 mg via ORAL
  Filled 2014-07-05 (×7): qty 1

## 2014-07-05 MED ORDER — LIDOCAINE HCL (CARDIAC) 20 MG/ML IV SOLN
INTRAVENOUS | Status: AC
  Filled 2014-07-05: qty 5

## 2014-07-05 MED ORDER — LIDOCAINE HCL (CARDIAC) 20 MG/ML IV SOLN
INTRAVENOUS | Status: DC | PRN
  Administered 2014-07-05: 50 mg via INTRAVENOUS

## 2014-07-05 MED ORDER — ARTIFICIAL TEARS OP OINT
TOPICAL_OINTMENT | OPHTHALMIC | Status: AC
  Filled 2014-07-05: qty 3.5

## 2014-07-05 MED ORDER — OXYCODONE HCL 5 MG PO TABS: 5.0000 mg | ORAL_TABLET | Freq: Once | ORAL | Status: DC | PRN

## 2014-07-05 MED ORDER — CEFAZOLIN SODIUM-DEXTROSE 2-3 GM-% IV SOLR
INTRAVENOUS | Status: DC | PRN
  Administered 2014-07-05: 2 g via INTRAVENOUS

## 2014-07-05 MED ORDER — HYDROMORPHONE HCL 1 MG/ML IJ SOLN: 0.2500 mg | INTRAMUSCULAR | Status: DC | PRN

## 2014-07-05 MED ORDER — ONDANSETRON HCL 4 MG/2ML IJ SOLN
INTRAMUSCULAR | Status: DC | PRN
  Administered 2014-07-05: 4 mg via INTRAVENOUS

## 2014-07-05 MED ORDER — PROPOFOL 10 MG/ML IV BOLUS
INTRAVENOUS | Status: DC | PRN
  Administered 2014-07-05: 100 mg via INTRAVENOUS
  Administered 2014-07-05: 50 mg via INTRAVENOUS

## 2014-07-05 MED ORDER — ASPIRIN EC 325 MG PO TBEC
325.0000 mg | DELAYED_RELEASE_TABLET | Freq: Every day | ORAL | Status: DC
  Administered 2014-07-05 – 2014-07-07 (×3): 325 mg via ORAL
  Filled 2014-07-05 (×3): qty 1

## 2014-07-05 MED ORDER — FENTANYL CITRATE 0.05 MG/ML IJ SOLN
INTRAMUSCULAR | Status: DC | PRN
  Administered 2014-07-05 (×3): 50 ug via INTRAVENOUS

## 2014-07-05 MED ORDER — LACTATED RINGERS IV SOLN
INTRAVENOUS | Status: DC | PRN
  Administered 2014-07-05: 11:00:00 via INTRAVENOUS

## 2014-07-05 MED ORDER — PHENYLEPHRINE HCL 10 MG/ML IJ SOLN
INTRAMUSCULAR | Status: DC | PRN
  Administered 2014-07-05 (×2): 40 ug via INTRAVENOUS

## 2014-07-05 MED ORDER — SODIUM CHLORIDE 0.9 % IV SOLN: INTRAVENOUS | Status: DC | PRN

## 2014-07-05 MED ORDER — ENOXAPARIN SODIUM 40 MG/0.4ML ~~LOC~~ SOLN
40.0000 mg | SUBCUTANEOUS | Status: DC
  Administered 2014-07-06 – 2014-07-07 (×2): 40 mg via SUBCUTANEOUS
  Filled 2014-07-05 (×2): qty 0.4

## 2014-07-05 MED ORDER — CLINDAMYCIN PHOSPHATE 900 MG/50ML IV SOLN
INTRAVENOUS | Status: AC
  Filled 2014-07-05: qty 50

## 2014-07-05 MED ORDER — CHLORHEXIDINE GLUCONATE 4 % EX LIQD
60.0000 mL | Freq: Once | CUTANEOUS | Status: DC
  Filled 2014-07-05: qty 60

## 2014-07-05 MED ORDER — SODIUM CHLORIDE 0.9 % IV SOLN
INTRAVENOUS | Status: DC | PRN
  Administered 2014-07-05: 10:00:00 via INTRAVENOUS

## 2014-07-05 MED ORDER — CEFAZOLIN SODIUM-DEXTROSE 2-3 GM-% IV SOLR: 2.0000 g | INTRAVENOUS | Status: DC

## 2014-07-05 SURGICAL SUPPLY — 70 items
BANDAGE ELASTIC 3 VELCRO ST LF (GAUZE/BANDAGES/DRESSINGS) ×4 IMPLANT
BANDAGE ESMARK 6X9 LF (GAUZE/BANDAGES/DRESSINGS) ×1 IMPLANT
BIT DRILL 2.5X2.75 QC CALB (BIT) ×2 IMPLANT
BIT DRILL 2.9 CANN QC NONSTRL (BIT) ×2 IMPLANT
BIT DRILL 3.5X5.5 QC CALB (BIT) ×2 IMPLANT
BLADE SURG 15 STRL LF DISP TIS (BLADE) ×1 IMPLANT
BLADE SURG 15 STRL SS (BLADE) ×3
BNDG CMPR 9X6 STRL LF SNTH (GAUZE/BANDAGES/DRESSINGS) ×1
BNDG COHESIVE 3X5 TAN STRL LF (GAUZE/BANDAGES/DRESSINGS) ×2 IMPLANT
BNDG COHESIVE 4X5 TAN STRL (GAUZE/BANDAGES/DRESSINGS) ×3 IMPLANT
BNDG COHESIVE 6X5 TAN STRL LF (GAUZE/BANDAGES/DRESSINGS) ×3 IMPLANT
BNDG ESMARK 6X9 LF (GAUZE/BANDAGES/DRESSINGS) ×3
BNDG GAUZE ELAST 4 BULKY (GAUZE/BANDAGES/DRESSINGS) ×2 IMPLANT
CANISTER SUCT 3000ML PPV (MISCELLANEOUS) ×3 IMPLANT
CHLORAPREP W/TINT 26ML (MISCELLANEOUS) ×3 IMPLANT
COVER SURGICAL LIGHT HANDLE (MISCELLANEOUS) ×3 IMPLANT
CUFF TOURNIQUET SINGLE 34IN LL (TOURNIQUET CUFF) ×3 IMPLANT
CUFF TOURNIQUET SINGLE 44IN (TOURNIQUET CUFF) IMPLANT
DRAPE OEC MINIVIEW 54X84 (DRAPES) ×3 IMPLANT
DRAPE U-SHAPE 47X51 STRL (DRAPES) ×3 IMPLANT
DRSG ADAPTIC 3X8 NADH LF (GAUZE/BANDAGES/DRESSINGS) IMPLANT
DRSG MEPITEL 4X7.2 (GAUZE/BANDAGES/DRESSINGS) ×2 IMPLANT
DRSG PAD ABDOMINAL 8X10 ST (GAUZE/BANDAGES/DRESSINGS) ×6 IMPLANT
ELECT REM PT RETURN 9FT ADLT (ELECTROSURGICAL) ×3
ELECTRODE REM PT RTRN 9FT ADLT (ELECTROSURGICAL) ×1 IMPLANT
GAUZE SPONGE 4X4 12PLY STRL (GAUZE/BANDAGES/DRESSINGS) IMPLANT
GLOVE BIO SURGEON STRL SZ7 (GLOVE) ×6 IMPLANT
GLOVE BIO SURGEON STRL SZ8 (GLOVE) ×3 IMPLANT
GLOVE BIOGEL PI IND STRL 7.5 (GLOVE) ×1 IMPLANT
GLOVE BIOGEL PI IND STRL 8 (GLOVE) ×1 IMPLANT
GLOVE BIOGEL PI INDICATOR 7.5 (GLOVE) ×2
GLOVE BIOGEL PI INDICATOR 8 (GLOVE) ×2
GOWN STRL REUS W/ TWL LRG LVL3 (GOWN DISPOSABLE) ×2 IMPLANT
GOWN STRL REUS W/ TWL XL LVL3 (GOWN DISPOSABLE) ×1 IMPLANT
GOWN STRL REUS W/TWL LRG LVL3 (GOWN DISPOSABLE) ×6
GOWN STRL REUS W/TWL XL LVL3 (GOWN DISPOSABLE) ×3
KIT BASIN OR (CUSTOM PROCEDURE TRAY) ×3 IMPLANT
KIT ROOM TURNOVER OR (KITS) ×3 IMPLANT
NEEDLE 22X1 1/2 (OR ONLY) (NEEDLE) IMPLANT
NS IRRIG 1000ML POUR BTL (IV SOLUTION) ×3 IMPLANT
PACK ORTHO EXTREMITY (CUSTOM PROCEDURE TRAY) ×3 IMPLANT
PAD ARMBOARD 7.5X6 YLW CONV (MISCELLANEOUS) ×6 IMPLANT
PAD CAST 3X4 CTTN HI CHSV (CAST SUPPLIES) IMPLANT
PAD CAST 4YDX4 CTTN HI CHSV (CAST SUPPLIES) ×1 IMPLANT
PADDING CAST COTTON 3X4 STRL (CAST SUPPLIES) ×3
PADDING CAST COTTON 4X4 STRL (CAST SUPPLIES) ×3
PLATE 6H 3.5 143556 (Plate) ×2 IMPLANT
SCREW ACE CAN 4.0 40M (Screw) ×4 IMPLANT
SCREW CORT 3.5X16 815037016 (Screw) ×6 IMPLANT
SCREW CORTICAL 3.5MM 18MM (Screw) ×4 IMPLANT
SCREW CORTICAL 3.5MM 22MM (Screw) ×2 IMPLANT
SPONGE GAUZE 4X4 12PLY STER LF (GAUZE/BANDAGES/DRESSINGS) ×2 IMPLANT
SPONGE LAP 18X18 X RAY DECT (DISPOSABLE) ×3 IMPLANT
STAPLER VISISTAT 35W (STAPLE) IMPLANT
SUCTION FRAZIER TIP 10 FR DISP (SUCTIONS) ×3 IMPLANT
SUT ETHILON 3 0 PS 1 (SUTURE) ×2 IMPLANT
SUT MNCRL AB 3-0 PS2 18 (SUTURE) ×2 IMPLANT
SUT PROLENE 3 0 PS 2 (SUTURE) ×3 IMPLANT
SUT VIC AB 2-0 CT1 27 (SUTURE) ×6
SUT VIC AB 2-0 CT1 TAPERPNT 27 (SUTURE) ×2 IMPLANT
SUT VIC AB 2-0 CTB1 (SUTURE) ×2 IMPLANT
SUT VIC AB 3-0 PS2 18 (SUTURE) ×3
SUT VIC AB 3-0 PS2 18XBRD (SUTURE) ×1 IMPLANT
SYR CONTROL 10ML LL (SYRINGE) IMPLANT
TOWEL OR 17X24 6PK STRL BLUE (TOWEL DISPOSABLE) ×3 IMPLANT
TOWEL OR 17X26 10 PK STRL BLUE (TOWEL DISPOSABLE) ×3 IMPLANT
TUBE CONNECTING 12'X1/4 (SUCTIONS) ×1
TUBE CONNECTING 12X1/4 (SUCTIONS) ×2 IMPLANT
WATER STERILE IRR 1000ML POUR (IV SOLUTION) ×3 IMPLANT
WIRE K 1.6MM 144256 (MISCELLANEOUS) ×2 IMPLANT

## 2014-07-05 NOTE — Discharge Instructions (Signed)
Sharon ArthursJohn Perrin Eddleman, MD [facility]  Please read the following information regarding your care after surgery.  Medications  You only need a prescription for the narcotic pain medicine (ex. oxycodone, Percocet, Norco).  All of the other medicines listed below are available over the counter. X Norco as prescribed for pain  Narcotic pain medicine (ex. oxycodone, Percocet, Vicodin) will cause constipation.  To prevent this problem, take the following medicines while you are taking any pain medicine. X docusate sodium (Colace) 100 mg twice a day X senna (Senokot) 2 tablets twice a day  X To help prevent blood clots, take an aspirin (325 mg) once a day for a month after surgery.  You should also get up every hour while you are awake to move around.    Weight Bearing ? Bear weight when you are able on your operated leg or foot. ? Bear weight only on the heel of your operated foot in the post-op shoe. X Do not bear any weight on the operated leg or foot.  Cast / Splint / Dressing X Keep your splint or cast clean and dry.  Dont put anything (coat hanger, pencil, etc) down inside of it.  If it gets damp, use a hair dryer on the cool setting to dry it.  If it gets soaked, call the office to schedule an appointment for a cast change. ? Remove your dressing 3 days after surgery and cover the incisions with dry dressings.    After your dressing, cast or splint is removed; you may shower, but do not soak or scrub the wound.  Allow the water to run over it, and then gently pat it dry.  Swelling It is normal for you to have swelling where you had surgery.  To reduce swelling and pain, keep your toes above your nose for at least 3 days after surgery.  It may be necessary to keep your foot or leg elevated for several weeks.  If it hurts, it should be elevated.  Follow Up Call my office at [phone] when you are discharged from the hospital or surgery center to schedule an appointment to be seen two  weeks after surgery.  Call my office at [phone] if you develop a fever >101.5 F, nausea, vomiting, bleeding from the surgical site or severe pain.

## 2014-07-05 NOTE — Transfer of Care (Signed)
Immediate Anesthesia Transfer of Care Note  Patient: [name]  Procedure(s) Performed: Procedure(s): OPEN REDUCTION INTERNAL FIXATION (ORIF) ANKLE FRACTURE (Right)  Patient Location: PACU  Anesthesia Type:General and Regional  Level of Consciousness: awake, alert , oriented and sedated  Airway & Oxygen Therapy: Patient Spontanous Breathing and Patient connected to nasal cannula oxygen  Post-op Assessment: Report given to RN, Post -op Vital signs reviewed and stable and Patient moving all extremities  Post vital signs: Reviewed and stable  Last Vitals:  Filed Vitals:   07/05/14 0901  BP: 140/63  Pulse: 73  Temp: 37.1 C  Resp:     Complications: No apparent anesthesia complications

## 2014-07-05 NOTE — Brief Op Note (Signed)
07/02/2014 - 07/05/2014  12:14 PM  PATIENT:  [name]  81 y.o. female  PRE-OPERATIVE DIAGNOSIS:  Right ankle trimalleolar fracture dislocation s/p closed reduction and splinting.  POST-OPERATIVE DIAGNOSIS:  same  Procedure(s): 1.  ORIF right ankle trimalleolar fracture without fixation of the posterior lip 2.  AP, lateral and mortise xrays of the right ankle  SURGEON:  Toni ArthursJohn Sharone Picchi, MD  ASSISTANT: n/a  ANESTHESIA:   General  EBL:  minimal   TOURNIQUET:   Total Tourniquet Time Documented: Thigh (Right) - 41 minutes Total: Thigh (Right) - 41 minutes   COMPLICATIONS:  None apparent  DISPOSITION:  Extubated, awake and stable to recovery.  DICTATION ID:  [id number]

## 2014-07-05 NOTE — Progress Notes (Signed)
OT Cancellation Note  Patient Details Name: Rochele RaringClarice P Huestis MRN: [id number] DOB: 1926/05/14   Cancelled Treatment:    Reason Eval/Treat Not Completed: Other (comment) Pt is Medicare and current D/C plan is SNF. No apparent immediate acute care OT needs, therefore will defer OT to SNF. If OT eval is needed please call Acute Rehab Dept. at [phone] or text page OT at [phone].  [facility]  Angelicia Lessner, OTR/L  [phone] 07/05/2014 07/05/2014, 2:36 PM

## 2014-07-05 NOTE — Anesthesia Postprocedure Evaluation (Signed)
  Anesthesia Post-op Note  Patient: [name]  Procedure(s) Performed: Procedure(s): OPEN REDUCTION INTERNAL FIXATION (ORIF) ANKLE FRACTURE (Right)  Patient Location: PACU  Anesthesia Type:GA combined with regional for post-op pain  Level of Consciousness: awake and alert   Airway and Oxygen Therapy: Patient Spontanous Breathing  Post-op Pain: none  Post-op Assessment: Post-op Vital signs reviewed  Post-op Vital Signs: Reviewed  Last Vitals:  Filed Vitals:   07/05/14 1248  BP: 145/59  Pulse: 87  Temp:   Resp: 20    Complications: No apparent anesthesia complications

## 2014-07-05 NOTE — Progress Notes (Signed)
Subjective: 2 Days Post-Op Procedure(s) (LRB): CLOSED  REDUCTION ANKLE  WITH CAST APPLICATION (Right) Patient reports pain as mild.  Pt has not eaten this morning.  [facility]  No lovenox since yesterday at 0800.  Objective: Vital signs in last 24 hours: Temp:  [98 F (36.7 C)-98.8 F (37.1 C)] 98.7 F (37.1 C) (02/13 0658) Pulse Rate:  [84-97] 85 (02/13 0658) Resp:  [18] 18 (02/13 0658) BP: (110-153)/(47-70) 153/70 mmHg (02/13 0658) SpO2:  [94 %-98 %] 94 % (02/13 0658)  Intake/Output from previous day: 02/12 0701 - 02/13 0700 In: 240 [P.O.:240] Out: 750 [Urine:750] Intake/Output this shift:    No results for input(s): HGB in the last 72 hours. No results for input(s): WBC, RBC, HCT, PLT in the last 72 hours. No results for input(s): NA, K, CL, CO2, BUN, CREATININE, GLUCOSE, CALCIUM in the last 72 hours. No results for input(s): LABPT, INR in the last 72 hours.  PE:  wn wd elderly woman in nad.  a and O x 4.  Mood and affect normal.  EOMI.  resp unlabored.  R ankle immobilzied in a splint.  by report skin around ankle is intact with no blisters.  NVI at toes.  CT:  trimal ankle fracture with slight impaction of posterior tibial plafond.  Assessment/Plan: R ankle trimal fx/dislocation s/p closed reduction and splinting.  To OR today for ORIF.    The risks and benefits of the alternative treatment options have been discussed in detail.  The patient wishes to proceed with surgery and specifically understands risks of bleeding, infection, nerve damage, blood clots, need for additional surgery, amputation and death.     Toni ArthursHEWITT, Kane Kusek 07/05/2014, 7:47 AM

## 2014-07-05 NOTE — Anesthesia Preprocedure Evaluation (Addendum)
Anesthesia Evaluation  Patient identified by MRN, date of birth, ID band Patient awake    Reviewed: Allergy & Precautions, NPO status , Patient's Chart, lab work & pertinent test results  Airway Mallampati: I  TM Distance: >3 FB Neck ROM: Full    Dental  (+) Teeth Intact, Dental Advisory Given   Pulmonary neg pulmonary ROS,  breath sounds clear to auscultation        Cardiovascular hypertension, Pt. on medications Rhythm:Regular Rate:Normal     Neuro/Psych Depression negative neurological ROS     GI/Hepatic negative GI ROS, Neg liver ROS,   Endo/Other  negative endocrine ROS  Renal/GU negative Renal ROS     Musculoskeletal   Abdominal   Peds  Hematology negative hematology ROS (+)   Anesthesia Other Findings   Reproductive/Obstetrics                            Anesthesia Physical Anesthesia Plan  ASA: II  Anesthesia Plan: General   Post-op Pain Management:    Induction: Intravenous  Airway Management Planned: LMA and Oral ETT  Additional Equipment:   Intra-op Plan:   Post-operative Plan: Extubation in OR  Informed Consent: I have reviewed the patients History and Physical, chart, labs and discussed the procedure including the risks, benefits and alternatives for the proposed anesthesia with the patient or authorized representative who has indicated his/her understanding and acceptance.     Plan Discussed with: CRNA  Anesthesia Plan Comments:         Anesthesia Quick Evaluation

## 2014-07-05 NOTE — Anesthesia Procedure Notes (Addendum)
Anesthesia Regional Block:  Popliteal block  Pre-Anesthetic Checklist: ,, timeout performed, Correct Patient, Correct Site, Correct Laterality, Correct Procedure, Correct Position, site marked, Risks and benefits discussed,  Surgical consent,  Pre-op evaluation,  At surgeon's request and post-op pain management  Laterality: Right  Prep: chloraprep       Needles:  Injection technique: Single-shot  Needle Type: Echogenic Stimulator Needle     Needle Length: 9cm 9 cm Needle Gauge: 21 and 21 G    Additional Needles:  Procedures: ultrasound guided (picture in chart) and nerve stimulator Popliteal block Narrative:  Start time: 07/05/2014 9:55 AM End time: 07/05/2014 10:03 AM Injection made incrementally with aspirations every 5 mL.  Performed by: Personally  Anesthesiologist: Marcene DuosFITZGERALD, ROBERT E  Additional Notes: Risks and benefits explained to pt. Pt tolerated procedure well with no immediate complications.   Anesthesia Regional Block:  Adductor canal block  Pre-Anesthetic Checklist: ,, timeout performed, Correct Patient, Correct Site, Correct Laterality, Correct Procedure, Correct Position, site marked, Risks and benefits discussed,  Surgical consent,  Pre-op evaluation,  At surgeon's request and post-op pain management  Laterality: Right  Prep: chloraprep       Needles:  Injection technique: Single-shot  Needle Type: Echogenic Needle     Needle Length: 9cm 9 cm Needle Gauge: 21 and 21 G    Additional Needles:  Procedures: ultrasound guided (picture in chart) Adductor canal block Narrative:  Start time: 07/05/2014 10:04 AM End time: 07/05/2014 10:10 AM Injection made incrementally with aspirations every 5 mL.  Performed by: Personally  Anesthesiologist: Marcene DuosFITZGERALD, ROBERT E  Additional Notes: Risks and benefits explained. Pt tolerated procedure well with no immediate complications.   Procedure Name: LMA Insertion Date/Time: 07/05/2014 10:47 AM Performed  by: Fransisca KaufmannMEYER, Rayleen Wyrick E Pre-anesthesia Checklist: Patient identified, Emergency Drugs available, Suction available, Patient being monitored and Timeout performed Patient Re-evaluated:Patient Re-evaluated prior to inductionOxygen Delivery Method: Circle system utilized Preoxygenation: Pre-oxygenation with 100% oxygen Intubation Type: IV induction Ventilation: Mask ventilation without difficulty LMA: LMA inserted LMA Size: 4.0 Number of attempts: 1 Placement Confirmation: positive ETCO2 and breath sounds checked- equal and bilateral Tube secured with: Tape

## 2014-07-06 NOTE — Op Note (Signed)
NAMAntony [name]:  [name], [name]               ACCOUNT NO.:  [id number]  MEDICAL RECORD NO.:  [id number]  LOCATION:  [id number]                        FACILITY:  [facility]  PHYSICIAN:  Sharon ArthursJohn Piotr Christopher, MD        DATE OF BIRTH:  1926-03-01  DATE OF PROCEDURE:  07/05/2014 DATE OF DISCHARGE:                              OPERATIVE REPORT   PREOPERATIVE DIAGNOSIS:  Right ankle trimalleolar fracture dislocation, status post closed reduction and splinting.  POSTOPERATIVE DIAGNOSIS:  Right ankle trimalleolar fracture dislocation, status post closed reduction and splinting.  PROCEDURE: 1. Open reduction and internal fixation of right ankle trimalleolar     fracture without fixation of the posterior lip. 2. AP, lateral, and mortise radiographs of the right ankle.  SURGEON:  Sharon ArthursJohn Abdulla Pooley, MD  ANESTHESIA:  General.  ESTIMATED BLOOD LOSS:  Minimal.  TIME OF TOURNIQUET:  41 minute at 250 mmHg.  COMPLICATIONS:  None apparent.  DISPOSITION:  Extubated, awake and stable to recovery.  INDICATIONS FOR PROCEDURE:  The patient is an 81 year old woman who fell at home 3 days ago injuring her right ankle.  She initially had a trimalleolar fracture dislocation.  This was reduced on the day of admission under anesthesia by Dr. Charlann [name].  She presents now for a planned return to the operating room for open reduction and internal fixation of these displaced and unstable fractures.  She understands the risks and benefits, the alternatives to treatment options, and elects surgical treatment.  She specifically understands risks of bleeding, infection, nerve damage, blood clots, need for additional surgery, continued pain, amputation, and death.  PROCEDURE IN DETAIL:  After preoperative consent was obtained and the correct operative sites were identified, the patient was brought to the operating room and placed supine on the operating table.  General anesthesia was induced.  Preoperative antibiotics were administered. Surgical  time-out was taken.  The right lower extremity was prepped and draped in standard sterile fashion with tourniquet around the thigh. The extremity was exsanguinated, and the tourniquet was inflated to 250 mmHg.  A longitudinal incision was then made over the lateral malleolus. Sharp dissection was carried down through the skin and subcutaneous tissue.  The fracture site was identified and cleared of all hematoma. It was irrigated copiously.  The fracture was then reduced and held with a tenaculum.  A lag screw was inserted from posterior to anterior across the fracture sites.  It was noted to have appropriate purchase.  A 6- hole one-third tubular plate was then contoured to fit the lateral malleolus.  It was secured distally with 2 unicortical screws and proximally with 3 bicortical screws.  All hardware was from the [employer] small frag set.  AP and lateral radiographs confirmed appropriate position and length of the hardware and appropriate reduction of the lateral malleolus fracture.  Posterior malleolus fracture was also noted to be appropriately reduced and involved less than 20% of the articular surface of the tibial plafond.  Attention was then turned to the medial malleolus.  A longitudinal incision was made over the medial malleolus, and sharp dissection was carried down through skin and subcutaneous tissue.  The fracture site was identified.  All of the intervening periosteum was  excised.  The fracture was irrigated and cleaned of all hematoma as well.  Fracture was reduced and held with a tenaculum.  Two K-wires were inserted from the tip of the malleolus and into the metaphyseal bone of the distal tibia across the fracture site.  AP, mortise, and lateral radiographs confirmed appropriate position of the guide pins and appropriate reduction of the fracture.  Two 4 mm x 40 mm cannulated partially- threaded screws were inserted and both were noted to have adequate purchase.  AP,  mortise, and lateral radiographs confirmed appropriate position and length of all hardware and appropriate reduction of the fractures.  Guidepins were removed.  Wounds were irrigated copiously. Deep subcutaneous tissues were approximated with 2-0 Vicryl. Superficial subcutaneous tissues were approximated with inverted simple sutures of 3-0 Monocryl.  Running 3-0 nylon sutures were used to close the skin incision.  Sterile dressings were applied followed by well- padded short-leg splint.  Tourniquet was released after application of the dressings at 41 minutes.  The patient was awakened from anesthesia and transported to the recovery room in stable condition.  FOLLOWUP PLAN:  The patient will be nonweightbearing on the right lower extremity.  She will have physical therapy and occupational therapy consults.  We will plan to discharge her to rehab at the [facility] in [place] on Monday.  She will take aspirin for DVT prophylaxis upon discharge and Lovenox while she is here.  X-RAYS:  AP, mortise, and lateral radiographs of the right ankle were obtained intraoperatively.  These show interval reduction and fixation of the medial, lateral, and posterior malleolus fractures.  Alignment was appropriate.  Hardware is appropriately positioned and of the appropriate length.     Sharon Arthurs, MD     JH/MEDQ  D:  07/05/2014  T:  07/06/2014  Job:  [id number]

## 2014-07-06 NOTE — Progress Notes (Signed)
Physical Therapy Treatment Patient Details Name: [name] MRN: [id number] DOB: 01-06-1926 Today's Date: 07/06/2014    History of Present Illness 81 yo female adm after fall resulting in trimalleolar R ankle fx; s/p closed reduciton R ankle; now s/p ORIF of R ankle on 07/05/14    PT Comments    Good participation with PT and continued good maintenance of NWB RLE; Continue to recommend SNF for post-acute rehab to maximize independence and safety with mobility prior to dc home; Goals set 2/12 continue to be appropriate   Follow Up Recommendations  SNF;Supervision/Assistance - 24 hour     Equipment Recommendations  Rolling walker with 5" wheels    Recommendations for Other Services       Precautions / Restrictions Precautions Precautions: Fall Restrictions RLE Weight Bearing: Non weight bearing    Mobility  Bed Mobility Overal bed mobility: Needs Assistance Bed Mobility: Supine to Sit     Supine to sit: Min guard;Supervision     General bed mobility comments: incr time and cues to self assist safely  Transfers Overall transfer level: Needs assistance Equipment used: Rolling walker (2 wheeled) Transfers: Sit to/from [employer] Sit to Stand: Min assist Stand pivot transfers: Min assist       General transfer comment: cues for hand placement, sequence, use of UEs to maintain NWB, safety; assist throughout for balance and maneuvering RW  Ambulation/Gait Ambulation/Gait assistance: Min assist Ambulation Distance (Feet): 6 Feet Assistive device: Rolling walker (2 wheeled) Gait Pattern/deviations: Step-to pattern     General Gait Details: multi-modal cues for sequence,  NWB, RW safety and position; pt is easily distracted and requires frequent redirection to task   Stairs            Wheelchair Mobility    Modified Rankin (Stroke Patients Only)       Balance             Standing balance-Leahy Scale: Poor                       Cognition Arousal/Alertness: Awake/alert Behavior During Therapy: WFL for tasks assessed/performed Overall Cognitive Status: Within Functional Limits for tasks assessed                      Exercises      General Comments        Pertinent Vitals/Pain Pain Assessment: 0-10 Pain Score: 2  Pain Location: R ankle burning Pain Descriptors / Indicators: Burning Pain Intervention(s): Monitored during session;Repositioned    Home Living                      Prior Function            PT Goals (current goals can now be found in the care plan section) Acute Rehab PT Goals Patient Stated Goal: return to PLOF PT Goal Formulation: With patient Time For Goal Achievement: 07/11/14 Potential to Achieve Goals: Good Progress towards PT goals: Progressing toward goals    Frequency  Min 6X/week    PT Plan Current plan remains appropriate    Co-evaluation             End of Session Equipment Utilized During Treatment: Gait belt Activity Tolerance: Patient tolerated treatment well Patient left: with call bell/phone within reach;in chair     Time: 0936-1001 PT Time Calculation (min) (ACUTE ONLY): 25 min  Charges:  $Gait Training: 8-22 mins $Therapeutic Activity: 8-22 mins  G Codes:      Van ClinesGarrigan, Enis [facility] Hospital[facility] 07/06/2014, 10:48 AM  Van ClinesHolly Jeriann Sayres, PT  Acute Rehabilitation Services Pager [phone] Office [phone]

## 2014-07-06 NOTE — Progress Notes (Signed)
Subjective: 1 Day Post-Op Procedure(s) (LRB): OPEN REDUCTION INTERNAL FIXATION (ORIF) ANKLE FRACTURE (Right) Patient reports pain as mild.  Has gotten up to bedside commode already   Objective: Vital signs in last 24 hours: Temp:  [97.8 F (36.6 C)-98.7 F (37.1 C)] 98.3 F (36.8 C) (02/13 2033) Pulse Rate:  [78-96] 78 (02/13 2033) Resp:  [17-21] 18 (02/13 2033) BP: (97-148)/(48-69) 97/69 mmHg (02/13 2033) SpO2:  [93 %-98 %] 95 % (02/13 2033)  Intake/Output from previous day: 02/13 0701 - 02/14 0700 In: 740 [P.O.:240; I.[name].:500] Out: 375 [Urine:350; Blood:25] Intake/Output this shift:     Recent Labs  07/05/14 0929  HGB 12.2    Recent Labs  07/05/14 0929  HCT 36.0    Recent Labs  07/05/14 0929  NA 140  K 3.7  CL 105  BUN 17  CREATININE 0.60  GLUCOSE 106*   No results for input(s): LABPT, INR in the last 72 hours.  Neurologically intact Splint in good condition; wiggles toes; toes pink and warm  Assessment/Plan: 1 Day Post-Op Procedure(s) (LRB): OPEN REDUCTION INTERNAL FIXATION (ORIF) ANKLE FRACTURE (Right) Up with therapy  Discharge to SNF in next few days  [name] 07/06/2014, 9:27 AM

## 2014-07-07 ENCOUNTER — Inpatient Hospital Stay
Admission: RE | Admit: 2014-07-07 | Discharge: 2014-07-25 | Disposition: A | Discharge status: Home / Self Care | Payer: Medicare Other | Source: Ambulatory Visit | Attending: Internal Medicine | Admitting: Internal Medicine

## 2014-07-07 ENCOUNTER — Encounter (HOSPITAL_COMMUNITY): Payer: Self-pay | Admitting: Orthopedic Surgery

## 2014-07-07 MED ORDER — HYDROCODONE-ACETAMINOPHEN 5-325 MG PO TABS: 1.0000 | ORAL_TABLET | Freq: Four times a day (QID) | ORAL | Status: DC | PRN

## 2014-07-07 MED ORDER — ASPIRIN 325 MG PO TBEC: 325.0000 mg | DELAYED_RELEASE_TABLET | Freq: Every day | ORAL | Status: AC

## 2014-07-07 NOTE — Discharge Planning (Addendum)
Patient to be discharged to [facility]. Patient updated at bedside. Patient's son, Alwyn PeaDamon, updated regarding discharge.  Facility: [facility] Report number: [id number] [address]  Marcelline DeistEmily Kalee Mcclenathan, LCSWA [phone]) Licensed Clinical Social Worker Orthopedics [phone](5N17-32) and Surgical [phone](6N17-32)

## 2014-07-07 NOTE — Discharge Summary (Signed)
Physician Discharge Summary  Patient ID: Sharon RaringClarice P Saffo MRN: [id number] DOB/AGE: [birth year] 79 y.o.  Admit date: 07/02/2014 Discharge date: 07/07/2014  Admission Diagnoses:  Depression, htn, R ankle trimal fracture dislocation  Discharge Diagnoses:  Active Problems:   Trimalleolar fracture of ankle, closed   Closed right ankle fracture same as above S/p ORIF of right ankle trimal fracture  Discharged Condition: stable  Hospital Course: Pt was admitted on 2/10 and taken to the OR by Dr. Charlann Boxerlin for closed reduction and splinting of her right ankle trimal fracture dislocation.  She was [facility] following [facility] from [place] and was taken to the OR where she underwent ORIF of the right ankle trimal fracture.  She tolerated this procedure well and remained on 5N for the duration of her hospital stay.  She did well with PT and OT, but SNF for acute rehab was recommended since she lives alone.  She is discharged to SNF today in stable condition and is NWB on the R LE for 6 weeks post op.  She will need to continue with PT and OT and remain NWB on the R LE until deemed fit for discharge to home.  Consults: None  Significant Diagnostic Studies: radiology: X-Ray: right ankle trimal fracture  Treatments: surgery: ORIF R ankle fracture  Discharge Exam: Blood pressure 131/46, pulse 80, temperature 98.8 F (37.1 C), temperature source Oral, resp. rate 16, height 5\' 5"  (1.651 m), weight 61.236 kg (135 lb), SpO2 93 %. wn wd woman in nad.  alert and oriented.  EOMI.  resp unlabored.  R LE splitned.  NVI at right forefoot.  Disposition: [facility] [place]  Discharge Instructions    Call MD / Call 911    Complete by:  As directed   If you experience chest pain or shortness of breath, CALL 911 and be transported to the hospital emergency room.  If you develope a fever above 101 F, pus (white drainage) or increased drainage or redness at the wound, or calf pain, call your  surgeon's office.     Constipation Prevention    Complete by:  As directed   Drink plenty of fluids.  Prune juice may be helpful.  You may use a stool softener, such as Colace (over the counter) 100 mg twice a day.  Use MiraLax (over the counter) for constipation as needed.     Diet - low sodium heart healthy    Complete by:  As directed      Increase activity slowly as tolerated    Complete by:  As directed      Non weight bearing    Complete by:  As directed   Laterality:  right  Extremity:  Lower            Medication List    STOP taking these medications        promethazine 25 MG tablet  Commonly known as:  PHENERGAN     sulfamethoxazole-trimethoprim 800-160 MG per tablet  Commonly known as:  BACTRIM DS      TAKE these medications        aspirin 325 MG EC tablet  Take 1 tablet (325 mg total) by mouth daily.     diltiazem 240 MG 24 hr capsule  Commonly known as:  CARDIZEM CD  Take 1 capsule by mouth Daily.     HYDROcodone-acetaminophen 5-325 MG per tablet  Commonly known as:  NORCO/VICODIN  Take 1 tablet by mouth every 6 (six)  hours as needed for moderate pain.     lisinopril 40 MG tablet  Commonly known as:  PRINIVIL,ZESTRIL  Take 40 mg by mouth daily.     PARoxetine 20 MG tablet  Commonly known as:  PAXIL  Take 10 mg by mouth daily.     SYSTANE 0.4-0.3 % Soln  Generic drug:  Polyethyl Glycol-Propyl Glycol  Place 1 drop into both eyes daily as needed. For dry eyes     tamsulosin 0.4 MG Caps capsule  Commonly known as:  FLOMAX  Take 1 capsule (0.4 mg total) by mouth daily.           Follow-up Information    Follow up with Vali Capano, Jonny Ruiz, MD. Schedule an appointment as soon as possible for a visit in 2 weeks.   Specialty:  Orthopedic Surgery   Contact information:   [address] [place] [address] [phone]       Signed: Toni Arthurs 07/07/2014, 9:03 AM

## 2014-07-08 NOTE — Progress Notes (Signed)
CARE MANAGEMENT NOTE 07/08/2014  Patient:  [name],[name]   Account Number:  [id number]  Date Initiated:  07/04/2014  Documentation initiated by:  Lanier ClamMAHABIR,KATHY  Subjective/Objective Assessment:   81 y/o f admitted w/R ankle fx.     Action/Plan:   From home.   Anticipated DC Date:  07/07/2014   Anticipated DC Plan:  SKILLED NURSING FACILITY  In-house referral  Clinical Social Worker      DC Planning Services  CM consult      Choice offered to / List presented to:             Status of service:  Completed, signed off Medicare Important Message given?  YES Date Medicare IM given:  07/07/2014 Medicare IM given by:  [facility]  Discharge Disposition:  SKILLED NURSING FACILITY  Per UR Regulation:  Reviewed for med. necessity/level of care/duration of stay   Comments:  07/04/14 Lanier ClamKathy Mahabir RN BSN NCM [phone] POD#1 R ankle closed reduction.PT-SNf.CSW already aware & following.

## 2014-09-23 ENCOUNTER — Ambulatory Visit (HOSPITAL_COMMUNITY): Payer: Medicare Other | Attending: Orthopedic Surgery | Admitting: Physical Therapy

## 2014-09-23 DIAGNOSIS — R2689 Other abnormalities of gait and mobility: Secondary | ICD-10-CM | POA: Insufficient documentation

## 2014-09-23 NOTE — Therapy (Signed)
[facility] [address] [place], [place], [address] Phone: [phone]   Fax:  [phone]  Physical Therapy Evaluation  Patient Details  Name: [name] MRN: [id number] Date of Birth: Oct 04, 1925 Referring Provider:  Toni Arthurs, MD  Encounter Date: 09/23/2014      PT End of Session - 09/23/14 1532    Visit Number 1   Number of Visits 1   PT Start Time 1350   PT Stop Time 1430   PT Time Calculation (min) 40 min   Activity Tolerance Patient tolerated treatment well      Past Medical History  Diagnosis Date  . Hypertension   . Irregular heart beat   . Depression   . Dupuytren's contracture of both hands   . Kidney stone     Past Surgical History  Procedure Laterality Date  . Abdominal hysterectomy    . Dupuytren contracture release      left hand  . Laparoscopic cholecystectomy  03/19/12    Lovell Sheehan, +IOC  . Cholecystectomy  03/19/2012    Procedure: LAPAROSCOPIC CHOLECYSTECTOMY WITH INTRAOPERATIVE CHOLANGIOGRAM;  Surgeon: Dalia Heading, MD;  Location: [facility];  Service: General;  Laterality: N/A;  with common duct exploration  . Ercp N/A 03/21/2012    Procedure: ENDOSCOPIC RETROGRADE CHOLANGIOPANCREATOGRAPHY (ERCP);  Surgeon: Corbin Ade, MD;  Location: [facility];  Service: Endoscopy;  Laterality: N/A;  . Sphincterotomy N/A 03/21/2012    Procedure: SPHINCTEROTOMY;  Surgeon: Corbin Ade, MD;  Location: [facility];  Service: Endoscopy;  Laterality: N/A;  . Balloon dilation N/A 03/21/2012    Procedure: BALLOON DILATION;  Surgeon: Corbin Ade, MD;  Location: [facility];  Service: Endoscopy;  Laterality: N/A;  Balloon Stone Extraction  . Removal of stones N/A 03/21/2012    Procedure: REMOVAL OF STONES;  Surgeon: Corbin Ade, MD;  Location: [facility];  Service: Endoscopy;  Laterality: N/A;  . Ankle closed reduction Right 07/03/2014    Procedure: CLOSED  REDUCTION ANKLE  WITH CAST APPLICATION;  Surgeon: Shelda Pal, MD;   Location: [facility];  Service: Orthopedics;  Laterality: Right;  . Orif ankle fracture Right 07/05/2014    Procedure: OPEN REDUCTION INTERNAL FIXATION (ORIF) ANKLE FRACTURE;  Surgeon: Toni Arthurs, MD;  Location: [facility];  Service: Orthopedics;  Laterality: Right;    There were no vitals filed for this visit.  Visit Diagnosis:  Unstable balance      Subjective Assessment - 09/23/14 1349    Subjective [name] states she fx her ankle on February the 10th.  She went to the [facility] for 17 days and then she recieved [facility] until mid April.  She was placed in a cast for four weeks then a walking boot for four weeks.  She currently is wearing compression hose but she states that the compression hose is more trouble than the ankle.  She is now being referred to therapy    How long can you sit comfortably? no problem    How long can you stand comfortably? 15-30 minutes    How long can you walk comfortably? Pt is walking for two hours at a time all through the day.  She is hardly walking in the walking boot any longer.    Patient Stated Goals Pt states she is doing fine.    Currently in Pain? No/denies            [facility] PT Assessment - 09/23/14 1400    Assessment  Medical Diagnosis Rt ankle fx   Onset Date 07/02/14   Next MD Visit 10/08/2014   Prior [facility]   Precautions   Precautions None   Restrictions   Weight Bearing Restrictions No   Balance Screen   Has the patient fallen in the past 6 months Yes   How many times? 1   Has the patient had a decrease in activity level because of a fear of falling?  No   Is the patient reluctant to leave their home because of a fear of falling?  No   Prior Function   Level of Independence Independent with basic ADLs   Vocation Retired   Leisure cooking, work the Higher education careers adviserfood closet at [employer]    Cognition   Overall Cognitive Status Within Functional Limits for tasks assessed   Observation/Other Assessments   Focus on Therapeutic Outcomes (FOTO)  45    Single Leg Stance   Comments Lt 15 seconds; Rt 3 seconds    AROM   Right Ankle Dorsiflexion 12   Right Ankle Plantar Flexion 45   Right Ankle Inversion 30   Right Ankle Eversion 10   Strength   Right Ankle Dorsiflexion 5/5   Right Ankle Plantar Flexion 5/5   Right Ankle Inversion 5/5   Right Ankle Eversion 5/5                PT Education - 09/23/14 1531    Education provided Yes   Education Details HEP for balance   Person(s) Educated Patient   Methods Explanation;Handout   Comprehension Verbalized understanding          PT Short Term Goals - 09/23/14 1539    PT SHORT TERM GOAL #1   Title I HEP   Time 1   Period Days            Plan - 09/23/14 1533    Clinical Impression Statement [name] is an 81 yo female who fx her Rt ankle in February.  She recieved SNF as well as HH therapy and feels she is doing well.  At this time [name] is ambulating without an assistive device and is able to go up and down steps in a reciprocal manner.  She states she is able to be up on her feet all day long without problems.  Examination demonstrates normal ROM and strength with decreased blance.  Pt was counselled how important it is to work on her blance and that she should be doing something for her balance every day.  Therapist and pt discussed the benefits of just completing a HEP as well as the benefits of coming into therapy for a few weeks with patient deciding that she would prefer to work on her balance at home and if she felt she was not improving she would call back to the clinic.     PT Next Visit Plan D/C to HEP   PT Home Exercise Plan given   Consulted and Agree with Plan of Care Patient          G-Codes - 09/23/14 1539    Functional Assessment Tool Used clinical judgement   Functional Limitation Mobility: Walking and moving around   Mobility: Walking and Moving Around Current Status (Z6109(G8978) At least 1 percent but less than 20 percent impaired, limited or  restricted   Mobility: Walking and Moving Around Goal Status (U0454(G8979) At least 1 percent but less than 20 percent impaired, limited or restricted   Mobility: Walking  and Moving Around Discharge Status [phone] At least 1 percent but less than 20 percent impaired, limited or restricted       Problem List Patient Active Problem List   Diagnosis Date Noted  . Closed right ankle fracture 07/03/2014  . Trimalleolar fracture of ankle, closed 07/02/2014   Virgina Organ, PT CLT [phone]  09/23/2014, 3:40 PM  [facility] [address] [place], [place], [address] Phone: [phone]   Fax:  [phone]

## 2014-09-23 NOTE — Patient Instructions (Signed)
Balance: Unilateral   Attempt to balance on left leg, eyes open. Hold __30__ seconds. Repeat __3-5__ times per set. Do ___1_ sets per session. Do __2__ sessions per day. Perform exercise with eyes closed.  http://orth.exer.us/28   Copyright  VHI. All rights reserved.  Heel Raise: Bilateral (Standing)   Rise on balls of feet. Repeat _15___ times per set. Do __1_ sets per session. Do __2__ sessions per day.  http://orth.exer.us/38   Copyright  VHI. All rights reserved.  Step-Down / Step-Up   Stand on stair step or __6__ inch stool. Slowly bend right  leg, lowering other foot to floor. Return by straightening front leg. Repeat _10___ times per set. Do 1____ sets per session. Do ___2_ sessions per day.  http://orth.exer.us/684   Copyright  VHI. All rights reserved.  Balance: Three-Way Leg Swing   Stand on right  foot, hands on hips. Reach other foot forward __3__ times, sideways _3___ times, back _3___ times. Hold each position __5__ seconds. Relax. Repeat __3__ times per set. Do __1__ sets per session. Do __2__ sessions per day.  http://orth.exer.us/86   Copyright  VHI. All rights reserved.

## 2014-09-24 ENCOUNTER — Encounter (HOSPITAL_COMMUNITY): Payer: Medicare Other

## 2014-09-29 ENCOUNTER — Encounter (HOSPITAL_COMMUNITY): Payer: Medicare Other | Admitting: Physical Therapy

## 2014-10-01 ENCOUNTER — Encounter (HOSPITAL_COMMUNITY): Payer: Medicare Other

## 2014-10-06 ENCOUNTER — Encounter (HOSPITAL_COMMUNITY): Payer: Medicare Other | Admitting: Physical Therapy

## 2014-10-08 ENCOUNTER — Encounter (HOSPITAL_COMMUNITY): Payer: Medicare Other

## 2014-10-09 ENCOUNTER — Encounter (HOSPITAL_COMMUNITY): Payer: Medicare Other

## 2014-10-13 ENCOUNTER — Encounter (HOSPITAL_COMMUNITY): Payer: Medicare Other | Admitting: Physical Therapy

## 2014-10-15 ENCOUNTER — Encounter (HOSPITAL_COMMUNITY): Payer: Medicare Other | Admitting: Physical Therapy

## 2014-10-22 ENCOUNTER — Encounter (HOSPITAL_COMMUNITY): Payer: Medicare Other | Admitting: Physical Therapy

## 2014-12-17 ENCOUNTER — Encounter: Payer: Self-pay | Admitting: Cardiovascular Disease

## 2016-02-25 IMAGING — CT CT ANKLE*R* W/O CM
3 series · 16 of 33 positions shown, 19 images · non-contrast
Comparison: None.

CLINICAL DATA: Status post fall 07/02/2014 on steps at home. Right
ankle fracture. Preop planning prior to surgery.

EXAM:
CT OF THE RIGHT ANKLE WITHOUT CONTRAST
TECHNIQUE: Multidetector CT imaging of the right ankle was performed according
to the standard protocol. Multiplanar CT image reconstructions were
also generated.

[Series 5: ankle st · axial · 0.44mm/px · z∈[-1342,-1204]mm · 8 of 83 slices shown, 10 images]
[im 7/83  soft-tissue]
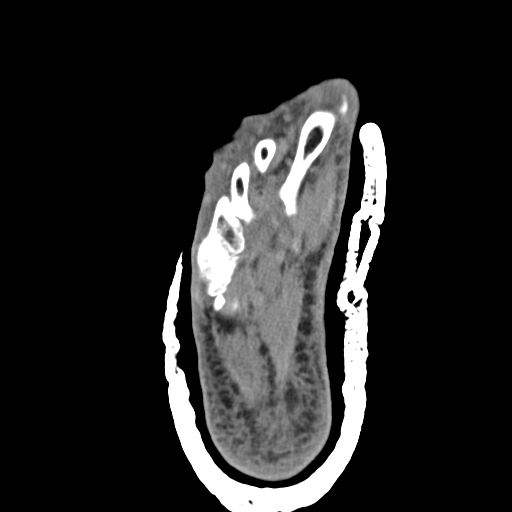
[im 7/83  bone]
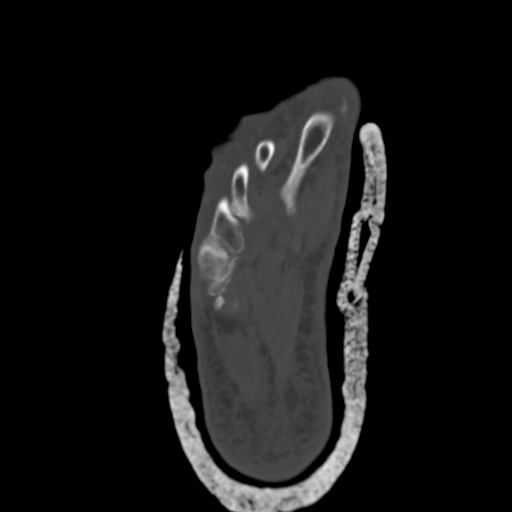
[im 19/83  bone]
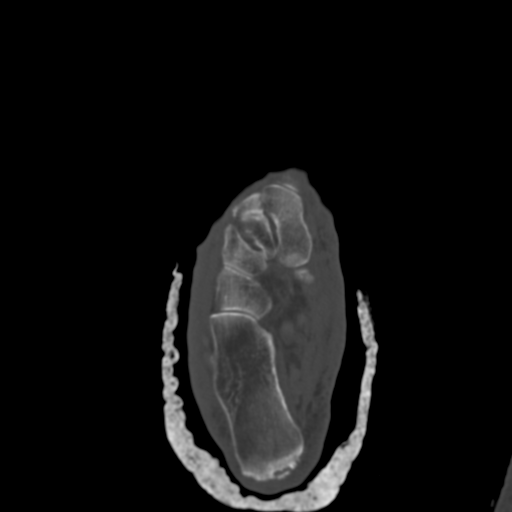
[im 26/83  bone]
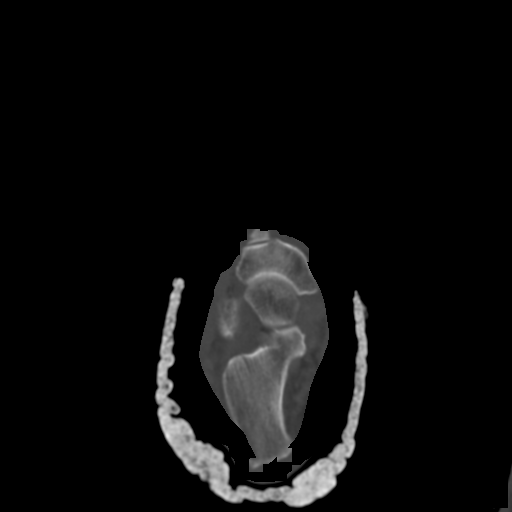
[im 38/83  bone]
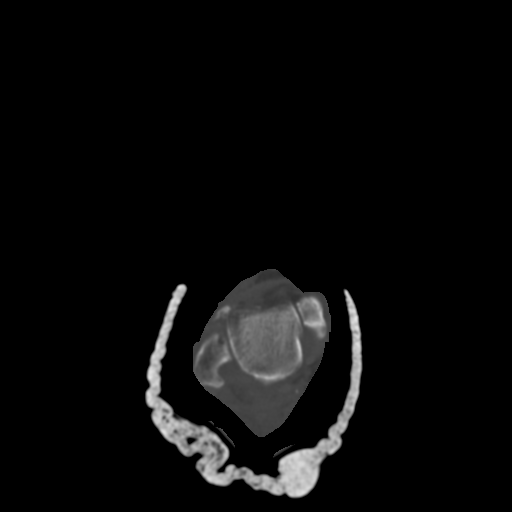
[im 45/83  soft-tissue]
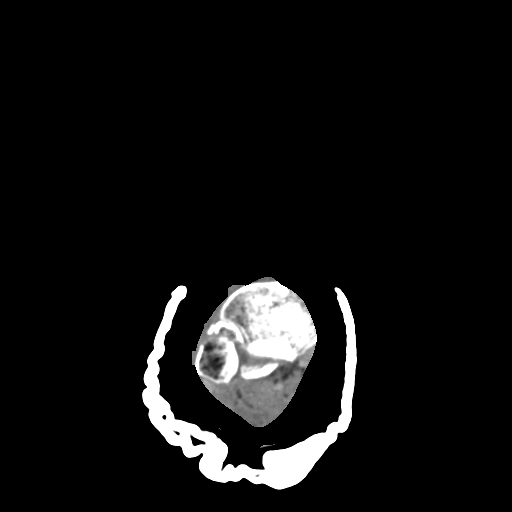
[im 45/83  bone]
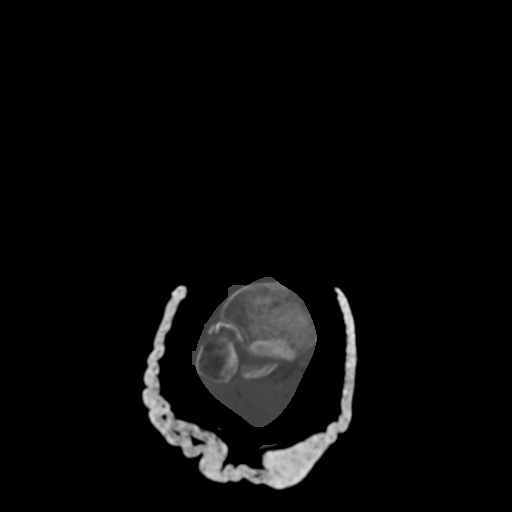
[im 57/83  bone]
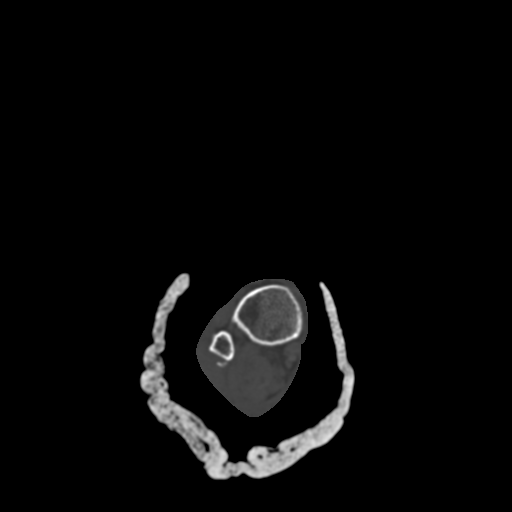
[im 64/83  bone]
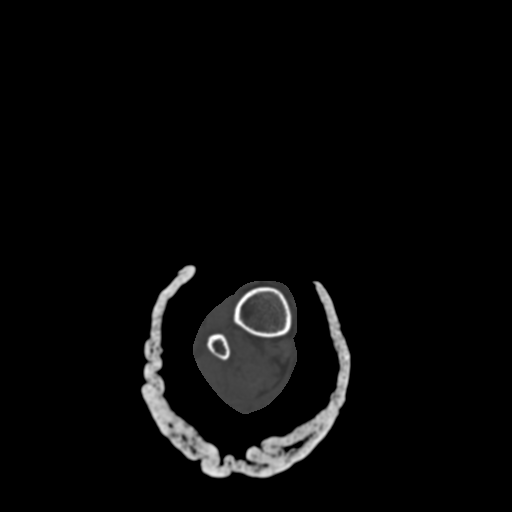
[im 76/83  bone]
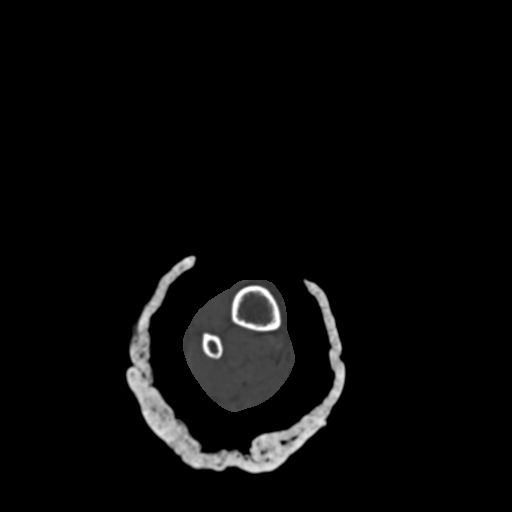

[Series 604: <mpr thick range(2)> · coronal · 0.44mm/px · 3 of 39 slices shown]
[im 8/39  bone]
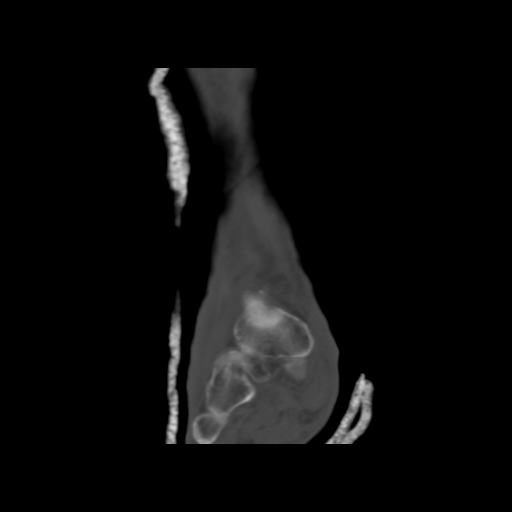
[im 16/39  bone]
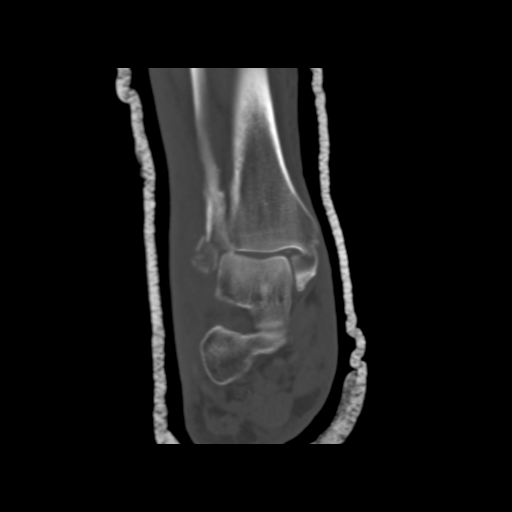
[im 23/39  bone]
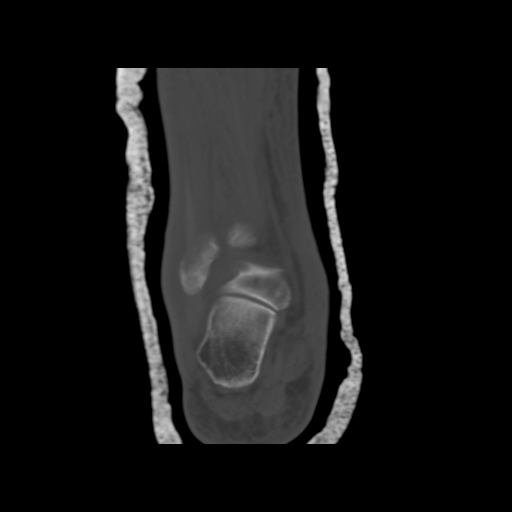

[Series 606: <mpr thick range(4)> · sagittal · 0.44mm/px · 5 of 32 slices shown, 6 images]
[im 11/32  bone]
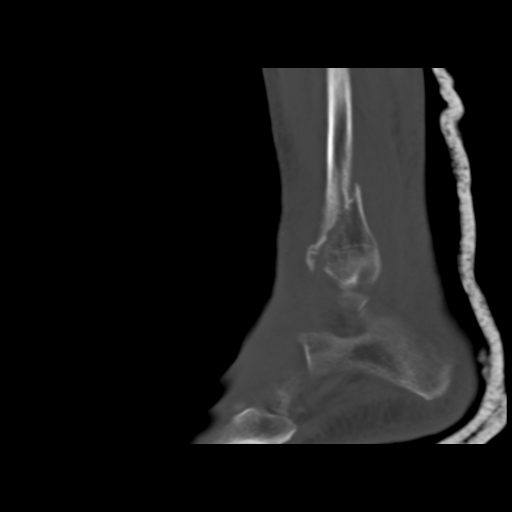
[im 13/32  bone]
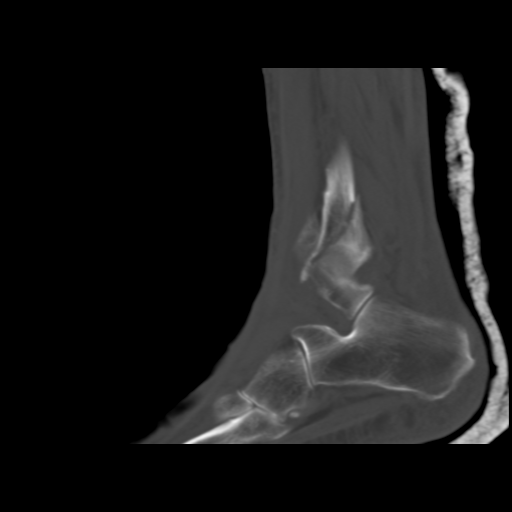
[im 16/32  soft-tissue]
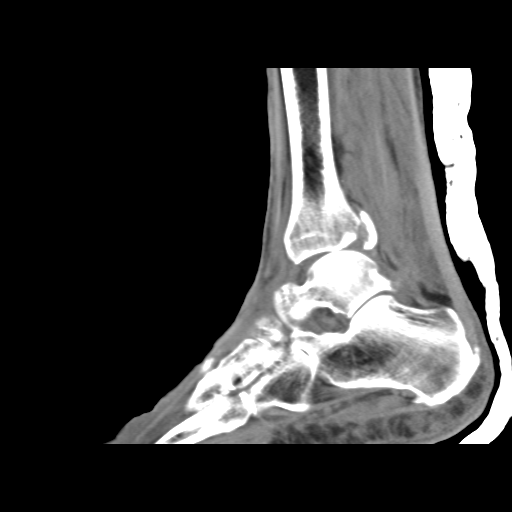
[im 16/32  bone]
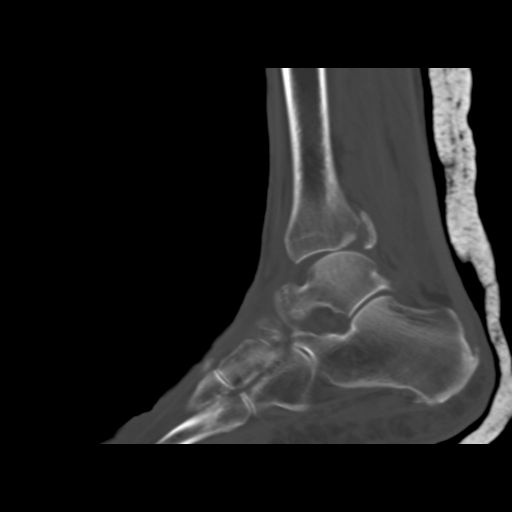
[im 19/32  bone]
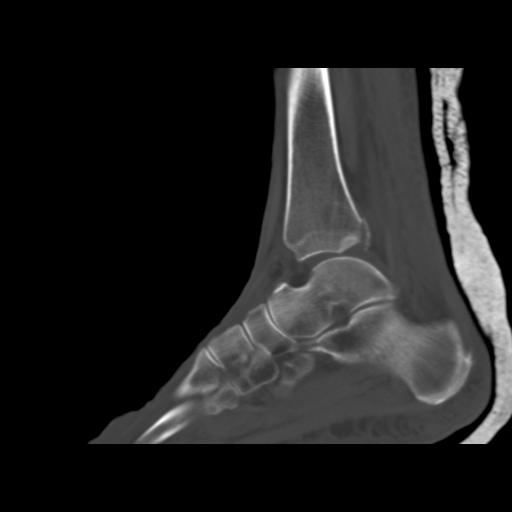
[im 21/32  bone]
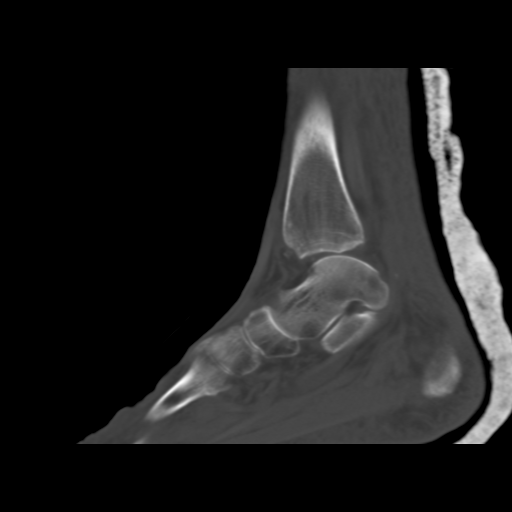

[16 of 33 positions shown; findings below may reference images not displayed]

FINDINGS: Mildly comminuted, oblique fracture of the distal fibular metaphysis
with 4 mm of posterior and lateral displacement. No angulation.

Mildly displaced transverse comminuted fracture of the medial
malleolus with 6 mm of medial displacement relative to the remainder
of the tibia. There is a mildly comminuted fracture of the posterior
malleolus with 5 mm of posterior displacement and a small fracture
cleft which involves the posterior articular surface of the tibial
plafond. There is disruption of the ankle mortise with 6.5 mm of
lateral displacement and approximately 10 mm of posterior
displacement of the talus dome relative to the tibial plafond.

There is no other fracture or dislocation. The subtalar joints are
normal. Sinus tarsi is normal. Incidental note is made of an os
peroneus. There is mild osteoarthritis of the first MTP joint. There
is severe soft tissue swelling around the ankle joint. The tibialis
posterior tendon courses adjacent to the medial malleolar fracture
with multiple fracture fragments along the anterior margin of the
tibialis posterior which is grossly intact. The remainder of the
flexor, extensor and peroneal tendons are grossly intact. The
Achilles tendon and plantar fascia are grossly intact. There is a
tiny plantar calcaneal spur. There are enthesopathic changes of the
Achilles tendon insertion.
IMPRESSION: 1. Trimalleolar right ankle fracture. Disruption of the ankle
mortise with anterior and posterior displacement of the talar dome
relative to the tibial plafond.

## 2017-09-25 ENCOUNTER — Ambulatory Visit (INDEPENDENT_AMBULATORY_CARE_PROVIDER_SITE_OTHER): Payer: Medicare Other | Admitting: Otolaryngology

## 2017-09-25 DIAGNOSIS — H903 Sensorineural hearing loss, bilateral: Secondary | ICD-10-CM

## 2019-06-17 DIAGNOSIS — I480 Paroxysmal atrial fibrillation: Secondary | ICD-10-CM | POA: Diagnosis not present

## 2019-06-17 DIAGNOSIS — M1990 Unspecified osteoarthritis, unspecified site: Secondary | ICD-10-CM | POA: Diagnosis not present

## 2019-06-17 DIAGNOSIS — I10 Essential (primary) hypertension: Secondary | ICD-10-CM | POA: Diagnosis not present

## 2019-06-17 DIAGNOSIS — E1159 Type 2 diabetes mellitus with other circulatory complications: Secondary | ICD-10-CM | POA: Diagnosis not present

## 2019-06-17 DIAGNOSIS — F329 Major depressive disorder, single episode, unspecified: Secondary | ICD-10-CM | POA: Diagnosis not present

## 2019-06-17 DIAGNOSIS — Z79899 Other long term (current) drug therapy: Secondary | ICD-10-CM | POA: Diagnosis not present

## 2019-06-24 DIAGNOSIS — F3340 Major depressive disorder, recurrent, in remission, unspecified: Secondary | ICD-10-CM | POA: Diagnosis not present

## 2019-06-24 DIAGNOSIS — E1129 Type 2 diabetes mellitus with other diabetic kidney complication: Secondary | ICD-10-CM | POA: Diagnosis not present

## 2019-06-24 DIAGNOSIS — I10 Essential (primary) hypertension: Secondary | ICD-10-CM | POA: Diagnosis not present

## 2019-06-24 DIAGNOSIS — I4892 Unspecified atrial flutter: Secondary | ICD-10-CM | POA: Diagnosis not present

## 2019-08-15 DIAGNOSIS — G500 Trigeminal neuralgia: Secondary | ICD-10-CM | POA: Diagnosis not present

## 2019-09-11 ENCOUNTER — Ambulatory Visit: Payer: Medicare PPO | Admitting: Neurology

## 2019-09-11 ENCOUNTER — Encounter: Payer: Self-pay | Admitting: Neurology

## 2019-09-11 ENCOUNTER — Other Ambulatory Visit: Payer: Self-pay

## 2019-09-11 VITALS — BP 144/68 | HR 86 | Temp 97.5°F | Ht 64.0 in | Wt 131.4 lb

## 2019-09-11 DIAGNOSIS — R42 Dizziness and giddiness: Secondary | ICD-10-CM

## 2019-09-11 DIAGNOSIS — G518 Other disorders of facial nerve: Secondary | ICD-10-CM | POA: Diagnosis not present

## 2019-09-11 DIAGNOSIS — G500 Trigeminal neuralgia: Secondary | ICD-10-CM | POA: Insufficient documentation

## 2019-09-11 MED ORDER — GABAPENTIN 100 MG PO CAPS: 100.0000 mg | ORAL_CAPSULE | Freq: Three times a day (TID) | ORAL | 2 refills | Status: DC

## 2019-09-11 NOTE — Patient Instructions (Signed)
I had a long discussion with the patient and her son regarding her left facial pain which likely represents trigeminal neuralgia.  Recommend trial of gabapentin 100 mg twice daily for a week to be increased if tolerated without side effects to 3 times daily.  If she is unable to tolerate this may consider switching to Lyrica or Topamax in the future.  Check CT scan of the head with and without contrast as patient is extremely claustrophobic and not be able to tolerate MRI.  I also advised her to do orthostatic tolerance exercises to help with her postural dizziness.  She will return for follow-up in the future in 2 months or call earlier if necessary.  Trigeminal Neuralgia  Trigeminal neuralgia is a nerve disorder that causes severe pain on one side of the face. The pain may last from a few seconds to several minutes. The pain is usually only on one side of the face. Symptoms may occur for days, weeks, or months and then go away for months or years. The pain may return and be worse than before. What are the causes? This condition is caused by damage or pressure to a nerve in the head that is called the trigeminal nerve. An attack can be triggered by:  Talking.  Chewing.  Putting on makeup.  Washing your face.  Shaving your face.  Brushing your teeth.  Touching your face. What increases the risk? You are more likely to develop this condition if you:  Are 56 years of age or older.  Are female. What are the signs or symptoms? The main symptom of this condition is severe pain in the:  Jaw.  Lips.  Eyes.  Nose.  Scalp.  Forehead.  Face. The pain may be:  Intense.  Stabbing.  Electric.  Shock-like. How is this diagnosed? This condition is diagnosed with a physical exam. A CT scan or an MRI may be done to rule out other conditions that can cause facial pain. How is this treated? This condition may be treated with:  Avoiding the things that trigger your  symptoms.  Taking prescription medicines (anticonvulsants).  Having surgery. This may be done in severe cases if other medical treatment does not provide relief.  Having procedures such as ablation, thermal, or radiation therapy. It may take up to one month for treatment to start relieving the pain. Follow these instructions at home: Managing pain  Learn as much as you can about how to manage your pain. Ask your health care provider if a pain specialist would be helpful.  Consider talking with a mental health care provider (psychologist) about how to cope with the pain.  Consider joining a pain support group. General instructions  Take over-the-counter and prescription medicines only as told by your health care provider.  Avoid the things that trigger your symptoms. It may help to: ? Chew on the unaffected side of your mouth. ? Avoid touching your face. ? Avoid blasts of hot or cold air.  Follow your treatment plan as told by your health care provider. This may include: ? Cognitive or behavioral therapy. ? Gentle, regular exercise. ? Meditation or yoga. ? Aromatherapy.  Keep all follow-up visits as told by your health care provider. You may need to be monitored closely to make sure treatment is working well for you. Where to find more information  Facial Pain Association: fpa-support.org Contact a health care provider if:  Your medicine is not helping your symptoms.  You have side effects from the  medicine used for treatment.  You develop new, unexplained symptoms, such as: ? Double vision. ? Facial weakness. ? Facial numbness. ? Changes in hearing or balance.  You feel depressed. Get help right away if:  Your pain is severe and is not getting better.  You develop suicidal thoughts. If you ever feel like you may hurt yourself or others, or have thoughts about taking your own life, get help right away. You can go to your nearest emergency department or  call:  Your local emergency services (911 in the [place]  A suicide crisis helpline, such as the [place] at (501)634-9833. This is open 24 hours a day. Summary  Trigeminal neuralgia is a nerve disorder that causes severe pain on one side of the face. The pain may last from a few seconds to several minutes.  This condition is caused by damage or pressure to a nerve in the head that is called the trigeminal nerve.  Treatment may include avoiding the things that trigger your symptoms, taking medicines, or having surgery or procedures. It may take up to one month for treatment to start relieving the pain.  Avoid the things that trigger your symptoms.  Keep all follow-up visits as told by your health care provider. You may need to be monitored closely to make sure treatment is working well for you. This information is not intended to replace advice given to you by your health care provider. Make sure you discuss any questions you have with your health care provider. Document Revised: 03/26/2018 Document Reviewed: 03/26/2018 Elsevier Patient Education  [place].

## 2019-09-11 NOTE — Progress Notes (Signed)
[facility] [address] [place]. [place] [address] [phone]       OFFICE CONSULT NOTE  Ms. [name] Date of Birth:  [birth year] Medical Record Number:  [id number]   Referring MD: Asencion Noble  Reason for Referral: Trigeminal neuralgia HPI: [name] is a 86 year old pleasant Caucasian lady seen today for initial office consultation visit.  She is accompanied by her son.  History is obtained from her and review of referral notes and I have also reviewed electronic medical records and available imaging films in PACS.  She has past medical history of hypertension, atrial flutter, depression and kidney stone.  For the last 6 weeks she has noticed some intermittent sharp shooting pain involving the left temple and periorbital frontal region.  This lasted only for a few seconds and is intermittent and often triggered by activities like washing her face or touching in that area.  This occurs several times a day but does not last long.  She has not tried any specific medications for this.  She then denies any pain while chewing or eating or swallowing.  She has not noticed any skin lesions or blisters in the area.  She also complains of dizziness which she first noticed after she got a second Covid shot the next day she noticed some postural dizziness when she got up she noticed that she was off balance and dizzy hungry for a short time and she could hold off and avoid falling.  This seems to be getting better.  She denies any history of headache, slurred speech, extremity weakness numbness gait or balance problems.  There is no prior history of strokes, TIAs, migraines, seizures significant head injury with loss of consciousness.  The patient has not tried any specific medications for this facial pain.  She states she is extremely claustrophobic and will not be able to tolerate even an open MRI even under oral sedation.  ROS:   14 system review of systems is positive for facial  pain, numbness, increased sensitivity to touch, dizziness, decreased hearing and all other systems negative  PMH:  Past Medical History:  Diagnosis Date  . Atrial flutter ([place])   . Depression   . Dupuytren's contracture of both hands   . Hypertension   . Irregular heart beat   . Kidney stone     Social History:  Social History   Socioeconomic History  . Marital status: Widowed    Spouse name: Not on file  . Number of children: Not on file  . Years of education: Not on file  . Highest education level: Not on file  Occupational History  . Not on file  Tobacco Use  . Smoking status: Never Smoker  . Smokeless tobacco: Never Used  Substance and Sexual Activity  . Alcohol use: No  . Drug use: No  . Sexual activity: Never  Other Topics Concern  . Not on file  Social History Narrative  . Not on file   Social Determinants of Health   Financial Resource Strain:   . Difficulty of Paying Living Expenses:   Food Insecurity:   . Worried About Charity fundraiser in the Last Year:   . Arboriculturist in the Last Year:   Transportation Needs:   . Film/video editor (Medical):   Marland Kitchen Lack of Transportation (Non-Medical):   Physical Activity:   . Days of Exercise per Week:   . Minutes of Exercise per Session:   Stress:   .  Feeling of Stress :   Social Connections:   . Frequency of Communication with Friends and Family:   . Frequency of Social Gatherings with Friends and Family:   . Attends Religious Services:   . Active Member of Clubs or Organizations:   . Attends Banker Meetings:   Marland Kitchen Marital Status:   Intimate Partner Violence:   . Fear of Current or Ex-Partner:   . Emotionally Abused:   Marland Kitchen Physically Abused:   . Sexually Abused:     Medications:   Current Outpatient Medications on File Prior to Visit  Medication Sig Dispense Refill  . aspirin EC 325 MG EC tablet Take 1 tablet (325 mg total) by mouth daily. 42 tablet 0  . diltiazem (CARDIZEM CD) 240  MG 24 hr capsule Take 1 capsule by mouth Daily.    Marland Kitchen lisinopril (PRINIVIL,ZESTRIL) 40 MG tablet Take 40 mg by mouth daily.    Bertram Gala Glycol-Propyl Glycol (SYSTANE) 0.4-0.3 % SOLN Place 1 drop into both eyes daily as needed. For dry eyes     No current facility-administered medications on file prior to visit.    Allergies:   Allergies  Allergen Reactions  . Penicillins Other (See Comments)    Unknown reaction from years ago  . Procaine Hcl Palpitations    Physical Exam General: Frail elderly Caucasian lady seated, in no evident distress Head: head normocephalic and atraumatic.   Neck: supple with no carotid or supraclavicular bruits Cardiovascular: regular rate and rhythm, no murmurs Musculoskeletal: no deformity Skin:  no rash/petichiae Vascular:  Normal pulses all extremities  Neurologic Exam Mental Status: Awake and fully alert. Oriented to place and time. Recent and remote memory intact. Attention span, concentration and fund of knowledge appropriate. Mood and affect appropriate.  Cranial Nerves: Fundoscopic exam reveals sharp disc margins. Pupils equal, briskly reactive to light. Extraocular movements full without nystagmus. Visual fields full to confrontation. Hearing is diminished bilaterally despite hearing aids. Facial sensation intact. Face, tongue, palate moves normally and symmetrically.  Motor: Normal bulk and tone. Normal strength in all tested extremity muscles. Sensory.: intact to touch , pinprick , position and vibratory sensation.  Slight hyperesthesia in the left temporal and frontal region but no sensory loss. Coordination: Rapid alternating movements normal in all extremities. Finger-to-nose and heel-to-shin performed accurately bilaterally. Gait and Station: Arises from chair without difficulty. Stance is normal. Gait demonstrates normal stride length and balance .  Unable to to heel, toe and tandem walk .Marland Kitchen  Reflexes: 1+ and symmetric. Toes downgoing.        ASSESSMENT: 86 year old Caucasian lady with intermittent left temporal and frontal shooting pain likely from trigeminal neuralgia involving ophthalmic division.  She also has positional dizziness which is likely due to orthostasis.     PLAN: I had a long discussion with the patient and her son regarding her left facial pain which likely represents trigeminal neuralgia.  Recommend trial of gabapentin 100 mg twice daily for a week to be increased if tolerated without side effects to 3 times daily.  If she is unable to tolerate this may consider switching to Lyrica or Topamax in the future.  Check CT scan of the head with and without contrast as patient is extremely claustrophobic and not be able to tolerate MRI.  I also advised her to do orthostatic tolerance exercises to help with her postural dizziness.  Greater than 50% time during this 50-minute consultation visit was spent on counseling and coordination of care about her facial pain  and dizziness and answering questions she will return for follow-up in the future in 2 months or call earlier if necessary. Delia Heady, MD  [facility] [address] [place], [place] [address]  Phone [phone] Fax [phone] Note: This document was prepared with digital dictation and possible smart phrase technology. Any transcriptional errors that result from this process are unintentional.

## 2019-09-12 ENCOUNTER — Telehealth: Payer: Self-pay | Admitting: Neurology

## 2019-09-12 ENCOUNTER — Other Ambulatory Visit: Payer: Self-pay

## 2019-09-12 DIAGNOSIS — G500 Trigeminal neuralgia: Secondary | ICD-10-CM

## 2019-09-12 NOTE — Telephone Encounter (Signed)
Patient wanted to go to [facility]. She is scheduled for 09/18/19 arrival time is 1:45 pm. Liquids only 4 hours prior to exam I left a voicemail informing this to the patient I also left their number of [phone] incase she needed to reschedule.. Also, they will need a resent BUN & creatine lab on her when you get a chance can you put the order in thank you!  Francine Graven Berkley Harvey: [id number] (exp. 09/12/19 to 10/12/19).

## 2019-09-12 NOTE — Telephone Encounter (Signed)
[employer] pending faxed notes.  

## 2019-09-12 NOTE — Telephone Encounter (Signed)
Labs in for BUN and creatime signed by Dr.Sethi.Given to [facility] MRI Nurse, adult.

## 2019-09-18 ENCOUNTER — Ambulatory Visit (HOSPITAL_COMMUNITY)
Admission: RE | Admit: 2019-09-18 | Discharge: 2019-09-18 | Disposition: A | Discharge status: Home / Self Care | Payer: Medicare PPO | Source: Ambulatory Visit | Attending: Neurology | Admitting: Neurology

## 2019-09-18 ENCOUNTER — Other Ambulatory Visit: Payer: Self-pay

## 2019-09-18 DIAGNOSIS — G500 Trigeminal neuralgia: Secondary | ICD-10-CM | POA: Diagnosis not present

## 2019-09-18 DIAGNOSIS — R519 Headache, unspecified: Secondary | ICD-10-CM | POA: Diagnosis not present

## 2019-09-18 LAB — POCT I-STAT CREATININE: Creatinine, Ser: 0.9 mg/dL (ref 0.44–1.00)

## 2019-09-18 MED ORDER — IOHEXOL 300 MG/ML  SOLN
75.0000 mL | Freq: Once | INTRAMUSCULAR | Status: AC | PRN
  Administered 2019-09-18: 75 mL via INTRAVENOUS

## 2019-09-27 NOTE — Progress Notes (Signed)
Kindly inform the patient that CT scan of the head shows age-appropriate changes of shrinkage of the brain and hardening of the arteries.  Incidental finding of paranasal sinus inflammation is noted.  No worrisome finding

## 2019-09-30 ENCOUNTER — Telehealth: Payer: Self-pay | Admitting: Neurology

## 2019-09-30 NOTE — Telephone Encounter (Signed)
Pt son called to check on the status of pt CT results

## 2019-10-01 ENCOUNTER — Telehealth: Payer: Self-pay

## 2019-10-01 NOTE — Telephone Encounter (Signed)
I called pts son Virl Diamond about CT scan results. I stated per Dr.Sethi theCT scan of the head shows age-appropriate changes of shrinkage of the brain and hardening of the arteries. Incidental finding of paranasal sinus inflammation is noted. No worrisome finding. The son verbalized understanding.

## 2019-10-01 NOTE — Telephone Encounter (Signed)
I called and left a message on his answering machine stating I was not aware of any increased neurological literature about trigeminal neuralgia and Covid vaccine and I do not think the both are related.  He he was advised to call me back if he had any questions

## 2019-10-01 NOTE — Telephone Encounter (Signed)
I had gave pts son [name] the results of the CT scan. He saw an article after the visit that over 400 people who receive the vaccine develop trigeminal neuralgia. He saw this visit after his mom appt with Dr.SEthi. He wanted to get Dr.SEthi thoughts on if he thinks this could of cause the facial pain.Also he wants to know will his mom have this trigeminal  neurlagia forever or will it ever go away. I stated Dr.Sethi will be in the office on next Wednesday and will have to research it. The son was find with waiting so Dr.Sethi can review. I stated message will be sent to him and son verbalized understanding.[name] would like to be call at [phone].

## 2019-10-02 NOTE — Telephone Encounter (Signed)
Pt son Virl Diamond at [phone] wants to know will the pts jaw pain, trigeminal neuralgia will ever go away or will it be permanent.

## 2019-10-03 NOTE — Telephone Encounter (Signed)
I spoke to the patient's son Virl Diamond and informed him that I was not aware of any particular increase incidence of trigeminal neuralgia and with Covid.  And it is hard to predict the course of trigeminal neuralgia in individual patient with many patients have an episodic course not necessarily chronic and it may go away in a few weeks to months.  He voiced understanding

## 2019-11-11 DIAGNOSIS — E1129 Type 2 diabetes mellitus with other diabetic kidney complication: Secondary | ICD-10-CM | POA: Diagnosis not present

## 2019-11-18 DIAGNOSIS — E1129 Type 2 diabetes mellitus with other diabetic kidney complication: Secondary | ICD-10-CM | POA: Diagnosis not present

## 2019-11-18 DIAGNOSIS — G500 Trigeminal neuralgia: Secondary | ICD-10-CM | POA: Diagnosis not present

## 2019-11-20 ENCOUNTER — Other Ambulatory Visit: Payer: Self-pay

## 2019-11-20 ENCOUNTER — Ambulatory Visit: Payer: Medicare PPO | Admitting: Neurology

## 2019-11-20 ENCOUNTER — Encounter: Payer: Self-pay | Admitting: Neurology

## 2019-11-20 VITALS — BP 127/58 | HR 69 | Wt 133.2 lb

## 2019-11-20 DIAGNOSIS — G500 Trigeminal neuralgia: Secondary | ICD-10-CM

## 2019-11-20 MED ORDER — GABAPENTIN 100 MG PO CAPS: 100.0000 mg | ORAL_CAPSULE | Freq: Three times a day (TID) | ORAL | 2 refills | Status: DC

## 2019-11-20 NOTE — Progress Notes (Signed)
[facility] [address] [place]. [place] [address] [phone]       OFFICE CONSULT NOTE  Ms. [name] Date of Birth:  [birth year] Medical Record Number:  [id number]   Referring MD: Carylon Perches  Reason for Referral: Trigeminal neuralgia HPI: [name] is a 86 year old pleasant Caucasian lady seen today for initial office consultation visit.  She is accompanied by her son.  History is obtained from her and review of referral notes and I have also reviewed electronic medical records and available imaging films in PACS.  She has past medical history of hypertension, atrial flutter, depression and kidney stone.  For the last 6 weeks she has noticed some intermittent sharp shooting pain involving the left temple and periorbital frontal region.  This lasted only for a few seconds and is intermittent and often triggered by activities like washing her face or touching in that area.  This occurs several times a day but does not last long.  She has not tried any specific medications for this.  She then denies any pain while chewing or eating or swallowing.  She has not noticed any skin lesions or blisters in the area.  She also complains of dizziness which she first noticed after she got a second Covid shot the next day she noticed some postural dizziness when she got up she noticed that she was off balance and dizzy hungry for a short time and she could hold off and avoid falling.  This seems to be getting better.  She denies any history of headache, slurred speech, extremity weakness numbness gait or balance problems.  There is no prior history of strokes, TIAs, migraines, seizures significant head injury with loss of consciousness.  The patient has not tried any specific medications for this facial pain.  She states she is extremely claustrophobic and will not be able to tolerate even an open MRI even under oral sedation. Update 11/20/2019 : She returns for follow-up after last visit  2 months ago.  She is accompanied by her son.  She states she is obtained substantial improvement in her facial pain after increasing the gabapentin 100 mg 3 times daily.  She seems to be tolerating it well without significant dizziness, tiredness or sleepiness.  Pain is not completely gone but she can live with it.  She underwent CT scan of the head on 09/18/2019 which showed age-appropriate changes of small vessel disease and generalized atrophy.  There was fluid opacification of the sphenoid sinuses incidentally.  She is concerned about discoloration of her right leg but she has not discussed this with her primary care physician yet.  She has no new complaints ROS:   14 system review of systems is positive for facial pain, numbness,, dizziness, decreased hearing and all other systems negative  PMH:  Past Medical History:  Diagnosis Date  . Atrial flutter (HCC)   . Depression   . Dupuytren's contracture of both hands   . Hypertension   . Irregular heart beat   . Kidney stone     Social History:  Social History   Socioeconomic History  . Marital status: Widowed    Spouse name: Not on file  . Number of children: Not on file  . Years of education: Not on file  . Highest education level: Not on file  Occupational History  . Not on file  Tobacco Use  . Smoking status: Never Smoker  . Smokeless tobacco: Never Used  Substance and Sexual Activity  .  Alcohol use: No  . Drug use: No  . Sexual activity: Never  Other Topics Concern  . Not on file  Social History Narrative  . Not on file   Social Determinants of Health   Financial Resource Strain:   . Difficulty of Paying Living Expenses:   Food Insecurity:   . Worried About Programme researcher, broadcasting/film/video in the Last Year:   . Barista in the Last Year:   Transportation Needs:   . Freight forwarder (Medical):   Marland Kitchen Lack of Transportation (Non-Medical):   Physical Activity:   . Days of Exercise per Week:   . Minutes of Exercise  per Session:   Stress:   . Feeling of Stress :   Social Connections:   . Frequency of Communication with Friends and Family:   . Frequency of Social Gatherings with Friends and Family:   . Attends Religious Services:   . Active Member of Clubs or Organizations:   . Attends Banker Meetings:   Marland Kitchen Marital Status:   Intimate Partner Violence:   . Fear of Current or Ex-Partner:   . Emotionally Abused:   Marland Kitchen Physically Abused:   . Sexually Abused:     Medications:   Current Outpatient Medications on File Prior to Visit  Medication Sig Dispense Refill  . aspirin EC 325 MG EC tablet Take 1 tablet (325 mg total) by mouth daily. 42 tablet 0  . diltiazem (CARDIZEM CD) 240 MG 24 hr capsule Take 1 capsule by mouth Daily.    Marland Kitchen gabapentin (NEURONTIN) 100 MG capsule Take 1 capsule (100 mg total) by mouth 3 (three) times daily. Start 1 capsule twice daily for 1 week and then 3 times daily as tolerated 90 capsule 2  . lisinopril (PRINIVIL,ZESTRIL) 40 MG tablet Take 40 mg by mouth daily.    Marland Kitchen PARoxetine (PAXIL) 10 MG tablet     . Polyethyl Glycol-Propyl Glycol (SYSTANE) 0.4-0.3 % SOLN Place 1 drop into both eyes daily as needed. For dry eyes     No current facility-administered medications on file prior to visit.    Allergies:   Allergies  Allergen Reactions  . Penicillins Other (See Comments)    Unknown reaction from years ago  . Procaine Hcl Palpitations    Physical Exam General: Frail elderly Caucasian lady seated, in no evident distress Head: head normocephalic and atraumatic.   Neck: supple with no carotid or supraclavicular bruits Cardiovascular: regular rate and rhythm, no murmurs Musculoskeletal: no deformity Skin:  no rash/petichiae Vascular:  Normal pulses all extremities  Neurologic Exam Mental Status: Awake and fully alert. Oriented to place and time. Recent and remote memory intact. Attention span, concentration and fund of knowledge appropriate. Mood and affect  appropriate.  Cranial Nerves: Fundoscopic exam not done pupils equal, briskly reactive to light. Extraocular movements full without nystagmus. Visual fields full to confrontation. Hearing is diminished bilaterally despite hearing aids. Facial sensation intact. Face, tongue, palate moves normally and symmetrically.  Motor: Normal bulk and tone. Normal strength in all tested extremity muscles. Sensory.: intact to touch , pinprick , position and vibratory sensation.  Slight hyperesthesia in the left temporal and frontal region but no sensory loss. Coordination: Rapid alternating movements normal in all extremities. Finger-to-nose and heel-to-shin performed accurately bilaterally. Gait and Station: Arises from chair without difficulty. Stance is normal. Gait demonstrates normal stride length and balance .  Unable to to heel, toe and tandem walk .Marland Kitchen  Reflexes: 1+ and symmetric. Toes  downgoing.       ASSESSMENT: 86 year old Caucasian lady with intermittent left temporal and frontal shooting pain likely from trigeminal neuralgia involving ophthalmic division who is doing well on the current regimen of gabapentin    PLAN: I had a long discussion with the patient and her son regarding her trigeminal neuralgic pain which seems to have responded fairly well to the current regimen of gabapentin 100 mg 3 times daily which she also seems to be tolerating well without significant side effects.  I recommend she continue the same dose for now.  We also discussed results of CT scan of the head which was unremarkable.  I advised her to see a primary care physician to evaluate her right leg reddish discoloration and swelling.  She will return for follow-up in the future in 3 months with my nurse practitioner Shanda Bumps or call earlier if necessary. Greater than 50% time during this 30-minute  visit was spent on counseling and coordination of care about her facial pain and dizziness and answering questions  . Delia Heady,  MD  [facility] [address] [place], [place] [address]  Phone [phone] Fax [phone] Note: This document was prepared with digital dictation and possible smart phrase technology. Any transcriptional errors that result from this process are unintentional.

## 2019-11-20 NOTE — Patient Instructions (Signed)
I had a long discussion with the patient and her son regarding her trigeminal neuralgic pain which seems to have responded fairly well to the current regimen of gabapentin 100 mg 3 times daily which she also seems to be tolerating well without significant side effects.  I recommend she continue the same dose for now.  We also discussed results of CT scan of the head which was unremarkable.  I advised her to see a primary care physician to evaluate her right leg reddish discoloration and swelling.  She will return for follow-up in the future in 3 months with my nurse practitioner Shanda Bumps or call earlier if necessary.

## 2019-11-27 DIAGNOSIS — D485 Neoplasm of uncertain behavior of skin: Secondary | ICD-10-CM | POA: Diagnosis not present

## 2019-11-27 DIAGNOSIS — L814 Other melanin hyperpigmentation: Secondary | ICD-10-CM | POA: Diagnosis not present

## 2019-11-27 DIAGNOSIS — L308 Other specified dermatitis: Secondary | ICD-10-CM | POA: Diagnosis not present

## 2019-11-27 DIAGNOSIS — I872 Venous insufficiency (chronic) (peripheral): Secondary | ICD-10-CM | POA: Diagnosis not present

## 2019-11-27 DIAGNOSIS — L821 Other seborrheic keratosis: Secondary | ICD-10-CM | POA: Diagnosis not present

## 2019-11-27 DIAGNOSIS — L820 Inflamed seborrheic keratosis: Secondary | ICD-10-CM | POA: Diagnosis not present

## 2019-12-03 ENCOUNTER — Telehealth: Payer: Self-pay | Admitting: Neurology

## 2019-12-03 NOTE — Telephone Encounter (Signed)
I called pt son about pt was told the gabapentin is causing her lower back pain. He stated his mom was told by former nurse that it can be causing her pain now. I stated we prescribed the medication for her trig menial neuralgia and facial area.I stated we have pts who take gabapentin for back pain.The son stated he has work as a Database administrator in the past and told him mom that gabapentin should not call cause back pain. He thinks pt may have a UTI and that is causing her pain. I advise the son that pt should continue taking the gabapentin for her facial pain and seek PCP to be evaluated for kidney, bladder or UTI infection to see if that is causing her pain. The son agreed and verbalized understanding.

## 2019-12-03 NOTE — Telephone Encounter (Signed)
[name],Chuck(son) has called to inform that pt believes her lower back pain that she is having is possibly from her gabapentin (NEURONTIN) 100 MG capsule.  Son is asking for a call to discuss

## 2020-03-05 DIAGNOSIS — E1129 Type 2 diabetes mellitus with other diabetic kidney complication: Secondary | ICD-10-CM | POA: Diagnosis not present

## 2020-03-12 ENCOUNTER — Encounter: Payer: Self-pay | Admitting: Adult Health

## 2020-03-12 ENCOUNTER — Ambulatory Visit: Payer: Medicare PPO | Admitting: Adult Health

## 2020-03-12 ENCOUNTER — Other Ambulatory Visit: Payer: Self-pay

## 2020-03-12 VITALS — BP 127/61 | HR 75 | Ht 64.0 in | Wt 131.0 lb

## 2020-03-12 DIAGNOSIS — G500 Trigeminal neuralgia: Secondary | ICD-10-CM

## 2020-03-12 NOTE — Patient Instructions (Addendum)
Your Plan:  Continue gabapentin 100mg  twice daily for trigemenial neuralgia     May follow up on a as needed basis       Thank you for coming to see at [facility]. I hope we have been able to provide you high quality care today.  You may receive a patient satisfaction survey over the next few weeks. We would appreciate your feedback and comments so that we may continue to improve ourselves and the health of our patients.

## 2020-03-12 NOTE — Progress Notes (Signed)
[facility] [address] [place]. [place] [address] [phone]       OFFICE FOLLOW UP NOTE  Ms. [name] Date of Birth:  [birth year] Medical Record Number:  [id number]   Referring MD: Carylon Perches  Reason for Referral: Trigeminal neuralgia   HPI:   Initial consult visit 09/11/2019 Dr. Pearlean Brownie: [name] is a 86 year old pleasant Caucasian lady seen today for initial office consultation visit.  She is accompanied by her son.  History is obtained from her and review of referral notes and I have also reviewed electronic medical records and available imaging films in PACS.  She has past medical history of hypertension, atrial flutter, depression and kidney stone.  For the last 6 weeks she has noticed some intermittent sharp shooting pain involving the left temple and periorbital frontal region.  This lasted only for a few seconds and is intermittent and often triggered by activities like washing her face or touching in that area.  This occurs several times a day but does not last long.  She has not tried any specific medications for this.  She then denies any pain while chewing or eating or swallowing.  She has not noticed any skin lesions or blisters in the area.  She also complains of dizziness which she first noticed after she got a second Covid shot the next day she noticed some postural dizziness when she got up she noticed that she was off balance and dizzy hungry for a short time and she could hold off and avoid falling.  This seems to be getting better.  She denies any history of headache, slurred speech, extremity weakness numbness gait or balance problems.  There is no prior history of strokes, TIAs, migraines, seizures significant head injury with loss of consciousness.  The patient has not tried any specific medications for this facial pain.  She states she is extremely claustrophobic and will not be able to tolerate even an open MRI even under oral sedation. Update  11/20/2019 Dr. Pearlean Brownie: She returns for follow-up after last visit 2 months ago.  She is accompanied by her son.  She states she is obtained substantial improvement in her facial pain after increasing the gabapentin 100 mg 3 times daily.  She seems to be tolerating it well without significant dizziness, tiredness or sleepiness.  Pain is not completely gone but she can live with it.  She underwent CT scan of the head on 09/18/2019 which showed age-appropriate changes of small vessel disease and generalized atrophy.  There was fluid opacification of the sphenoid sinuses incidentally.  She is concerned about discoloration of her right leg but she has not discussed this with her primary care physician yet.  She has no new complaints  Update 03/12/2020 JM: [name] returns for follow-up regarding trigeminal neuralgia accompanied by her son.  She reports complete resolution of facial pain over the past 2 weeks and has been able to decrease gabapentin dose from 100 mg 3 times daily now taking twice daily.  She plans on further decreasing dosage with hopes of completely discontinuing as her pain has resolved.  No concerns at this time.    ROS:   14 system review of systems is positive for those listed in HPI and all other systems negative  PMH:  Past Medical History:  Diagnosis Date  . Atrial flutter (HCC)   . Depression   . Dupuytren's contracture of both hands   . Hypertension   . Irregular heart beat   .  Kidney stone     Social History:  Social History   Socioeconomic History  . Marital status: Widowed    Spouse name: Not on file  . Number of children: Not on file  . Years of education: Not on file  . Highest education level: Not on file  Occupational History  . Not on file  Tobacco Use  . Smoking status: Never Smoker  . Smokeless tobacco: Never Used  Substance and Sexual Activity  . Alcohol use: No  . Drug use: No  . Sexual activity: Never  Other Topics Concern  . Not on file  Social  History Narrative  . Not on file   Social Determinants of Health   Financial Resource Strain:   . Difficulty of Paying Living Expenses: Not on file  Food Insecurity:   . Worried About Programme researcher, broadcasting/film/video in the Last Year: Not on file  . Ran Out of Food in the Last Year: Not on file  Transportation Needs:   . Lack of Transportation (Medical): Not on file  . Lack of Transportation (Non-Medical): Not on file  Physical Activity:   . Days of Exercise per Week: Not on file  . Minutes of Exercise per Session: Not on file  Stress:   . Feeling of Stress : Not on file  Social Connections:   . Frequency of Communication with Friends and Family: Not on file  . Frequency of Social Gatherings with Friends and Family: Not on file  . Attends Religious Services: Not on file  . Active Member of Clubs or Organizations: Not on file  . Attends Banker Meetings: Not on file  . Marital Status: Not on file  Intimate Partner Violence:   . Fear of Current or Ex-Partner: Not on file  . Emotionally Abused: Not on file  . Physically Abused: Not on file  . Sexually Abused: Not on file    Medications:   Current Outpatient Medications on File Prior to Visit  Medication Sig Dispense Refill  . aspirin EC 325 MG EC tablet Take 1 tablet (325 mg total) by mouth daily. 42 tablet 0  . diltiazem (CARDIZEM CD) 240 MG 24 hr capsule Take 1 capsule by mouth Daily.    Marland Kitchen gabapentin (NEURONTIN) 100 MG capsule Take 1 capsule (100 mg total) by mouth 3 (three) times daily. Start 1 capsule twice daily for 1 week and then 3 times daily as tolerated 90 capsule 2  . lisinopril (PRINIVIL,ZESTRIL) 40 MG tablet Take 40 mg by mouth daily.    Marland Kitchen PARoxetine (PAXIL) 10 MG tablet     . Polyethyl Glycol-Propyl Glycol (SYSTANE) 0.4-0.3 % SOLN Place 1 drop into both eyes daily as needed. For dry eyes     No current facility-administered medications on file prior to visit.    Allergies:   Allergies  Allergen Reactions  .  Penicillins Other (See Comments)    Unknown reaction from years ago  . Procaine Hcl Palpitations    Physical Exam Today's Vitals   03/12/20 1543  BP: 127/61  Pulse: 75  Weight: 131 lb (59.4 kg)  Height: 5\' 4"  (1.626 m)   Body mass index is 22.49 kg/m.  General: Frail very pleasant elderly Caucasian lady seated, in no evident distress Head: head normocephalic and atraumatic.   Neck: supple with no carotid or supraclavicular bruits Cardiovascular: regular rate and rhythm, no murmurs Musculoskeletal: no deformity Skin:  no rash/petichiae Vascular:  Normal pulses all extremities  Neurologic Exam Mental Status:  Awake and fully alert. Oriented to place and time. Recent and remote memory intact. Attention span, concentration and fund of knowledge appropriate. Mood and affect appropriate.  Cranial Nerves: pupils equal, briskly reactive to light. Extraocular movements full without nystagmus. Visual fields full to confrontation. Hearing is diminished bilaterally despite hearing aids. Facial sensation intact. Face, tongue, palate moves normally and symmetrically.  Motor: Normal bulk and tone. Normal strength in all tested extremity muscles. Sensory.: intact to touch , pinprick , position and vibratory sensation.  Slight hyperesthesia in the left temporal and frontal region but no sensory loss. Coordination: Rapid alternating movements normal in all extremities. Finger-to-nose and heel-to-shin performed accurately bilaterally. Gait and Station: Arises from chair without difficulty. Stance is normal. Gait demonstrates normal stride length and balance .  Unable to to heel, toe and tandem walk .Marland Kitchen  Reflexes: 1+ and symmetric. Toes downgoing.       ASSESSMENT/PLAN: 86 year old Caucasian lady with intermittent left temporal and frontal shooting pain likely from trigeminal neuralgia involving ophthalmic division with great improvement after use of gabapentin.  She has not had any facial pain over  the past 2 weeks.  -Recommend continued use of gabapentin 100 mg twice daily and slowly decrease as tolerated -Discussed possible triggers in the future and potential flareup in the future or gabapentin may need to be increased to restarted -She will call office with any worsening or reoccurring symptoms   Per patient request, she can follow-up on an as-needed basis   I spent 20 minutes of face-to-face and non-face-to-face time with patient and son.  This included previsit chart review, lab review, study review, order entry, electronic health record documentation, patient education and discussion regarding trigeminal neuralgia and great improvement with gabapentin, possible triggers for flareup episodes and answered all the questions to patient's satisfaction   Ihor Austin, [facility]  [facility] [address] [place], [place] [address]  Phone [phone] Fax [phone] Note: This document was prepared with digital dictation and possible smart phrase technology. Any transcriptional errors that result from this process are unintentional.

## 2020-03-13 DIAGNOSIS — R739 Hyperglycemia, unspecified: Secondary | ICD-10-CM | POA: Diagnosis not present

## 2020-03-13 DIAGNOSIS — E1129 Type 2 diabetes mellitus with other diabetic kidney complication: Secondary | ICD-10-CM | POA: Diagnosis not present

## 2020-03-13 DIAGNOSIS — G500 Trigeminal neuralgia: Secondary | ICD-10-CM | POA: Diagnosis not present

## 2020-03-13 DIAGNOSIS — F329 Major depressive disorder, single episode, unspecified: Secondary | ICD-10-CM | POA: Diagnosis not present

## 2020-03-13 NOTE — Progress Notes (Signed)
I agree with the above plan 

## 2020-06-19 DIAGNOSIS — M25571 Pain in right ankle and joints of right foot: Secondary | ICD-10-CM | POA: Diagnosis not present

## 2020-06-19 DIAGNOSIS — T8484XA Pain due to internal orthopedic prosthetic devices, implants and grafts, initial encounter: Secondary | ICD-10-CM | POA: Diagnosis not present

## 2020-06-23 DIAGNOSIS — Z79899 Other long term (current) drug therapy: Secondary | ICD-10-CM | POA: Diagnosis not present

## 2020-06-23 DIAGNOSIS — F329 Major depressive disorder, single episode, unspecified: Secondary | ICD-10-CM | POA: Diagnosis not present

## 2020-06-23 DIAGNOSIS — I10 Essential (primary) hypertension: Secondary | ICD-10-CM | POA: Diagnosis not present

## 2020-06-23 DIAGNOSIS — G500 Trigeminal neuralgia: Secondary | ICD-10-CM | POA: Diagnosis not present

## 2020-06-23 DIAGNOSIS — E1129 Type 2 diabetes mellitus with other diabetic kidney complication: Secondary | ICD-10-CM | POA: Diagnosis not present

## 2020-06-29 DIAGNOSIS — E1122 Type 2 diabetes mellitus with diabetic chronic kidney disease: Secondary | ICD-10-CM | POA: Diagnosis not present

## 2020-06-29 DIAGNOSIS — F325 Major depressive disorder, single episode, in full remission: Secondary | ICD-10-CM | POA: Diagnosis not present

## 2020-06-29 DIAGNOSIS — I10 Essential (primary) hypertension: Secondary | ICD-10-CM | POA: Diagnosis not present

## 2020-06-29 DIAGNOSIS — U071 COVID-19: Secondary | ICD-10-CM | POA: Diagnosis not present

## 2020-06-29 DIAGNOSIS — I483 Typical atrial flutter: Secondary | ICD-10-CM | POA: Diagnosis not present

## 2020-06-29 DIAGNOSIS — E730 Congenital lactase deficiency: Secondary | ICD-10-CM | POA: Diagnosis not present

## 2020-10-22 DIAGNOSIS — E1129 Type 2 diabetes mellitus with other diabetic kidney complication: Secondary | ICD-10-CM | POA: Diagnosis not present

## 2020-10-27 DIAGNOSIS — I10 Essential (primary) hypertension: Secondary | ICD-10-CM | POA: Diagnosis not present

## 2020-10-27 DIAGNOSIS — R7309 Other abnormal glucose: Secondary | ICD-10-CM | POA: Diagnosis not present

## 2020-10-27 DIAGNOSIS — F419 Anxiety disorder, unspecified: Secondary | ICD-10-CM | POA: Diagnosis not present

## 2020-10-27 DIAGNOSIS — E1122 Type 2 diabetes mellitus with diabetic chronic kidney disease: Secondary | ICD-10-CM | POA: Diagnosis not present

## 2021-02-22 DIAGNOSIS — E1129 Type 2 diabetes mellitus with other diabetic kidney complication: Secondary | ICD-10-CM | POA: Diagnosis not present

## 2021-03-01 DIAGNOSIS — R7309 Other abnormal glucose: Secondary | ICD-10-CM | POA: Diagnosis not present

## 2021-03-01 DIAGNOSIS — I10 Essential (primary) hypertension: Secondary | ICD-10-CM | POA: Diagnosis not present

## 2021-03-01 DIAGNOSIS — G5603 Carpal tunnel syndrome, bilateral upper limbs: Secondary | ICD-10-CM | POA: Diagnosis not present

## 2021-03-01 DIAGNOSIS — E1122 Type 2 diabetes mellitus with diabetic chronic kidney disease: Secondary | ICD-10-CM | POA: Diagnosis not present

## 2021-03-02 DIAGNOSIS — Z23 Encounter for immunization: Secondary | ICD-10-CM | POA: Diagnosis not present

## 2021-03-15 DIAGNOSIS — R200 Anesthesia of skin: Secondary | ICD-10-CM | POA: Diagnosis not present

## 2021-03-15 DIAGNOSIS — M720 Palmar fascial fibromatosis [Dupuytren]: Secondary | ICD-10-CM | POA: Diagnosis not present

## 2021-03-15 DIAGNOSIS — M19041 Primary osteoarthritis, right hand: Secondary | ICD-10-CM | POA: Diagnosis not present

## 2021-03-15 DIAGNOSIS — M19042 Primary osteoarthritis, left hand: Secondary | ICD-10-CM | POA: Diagnosis not present

## 2021-03-18 DIAGNOSIS — G5603 Carpal tunnel syndrome, bilateral upper limbs: Secondary | ICD-10-CM | POA: Diagnosis not present

## 2021-03-22 DIAGNOSIS — G5603 Carpal tunnel syndrome, bilateral upper limbs: Secondary | ICD-10-CM | POA: Diagnosis not present

## 2021-05-03 DIAGNOSIS — G5603 Carpal tunnel syndrome, bilateral upper limbs: Secondary | ICD-10-CM | POA: Diagnosis not present

## 2021-06-09 DIAGNOSIS — G5603 Carpal tunnel syndrome, bilateral upper limbs: Secondary | ICD-10-CM | POA: Diagnosis not present

## 2021-06-09 DIAGNOSIS — M19042 Primary osteoarthritis, left hand: Secondary | ICD-10-CM | POA: Diagnosis not present

## 2021-06-09 DIAGNOSIS — M19041 Primary osteoarthritis, right hand: Secondary | ICD-10-CM | POA: Diagnosis not present

## 2021-06-23 DIAGNOSIS — I4892 Unspecified atrial flutter: Secondary | ICD-10-CM | POA: Diagnosis not present

## 2021-06-23 DIAGNOSIS — E1129 Type 2 diabetes mellitus with other diabetic kidney complication: Secondary | ICD-10-CM | POA: Diagnosis not present

## 2021-06-23 DIAGNOSIS — G500 Trigeminal neuralgia: Secondary | ICD-10-CM | POA: Diagnosis not present

## 2021-06-23 DIAGNOSIS — Z79899 Other long term (current) drug therapy: Secondary | ICD-10-CM | POA: Diagnosis not present

## 2021-06-23 DIAGNOSIS — I10 Essential (primary) hypertension: Secondary | ICD-10-CM | POA: Diagnosis not present

## 2021-06-23 DIAGNOSIS — F329 Major depressive disorder, single episode, unspecified: Secondary | ICD-10-CM | POA: Diagnosis not present

## 2021-07-01 DIAGNOSIS — Z6821 Body mass index (BMI) 21.0-21.9, adult: Secondary | ICD-10-CM | POA: Diagnosis not present

## 2021-07-01 DIAGNOSIS — N1831 Chronic kidney disease, stage 3a: Secondary | ICD-10-CM | POA: Diagnosis not present

## 2021-07-01 DIAGNOSIS — I480 Paroxysmal atrial fibrillation: Secondary | ICD-10-CM | POA: Diagnosis not present

## 2021-07-01 DIAGNOSIS — E1122 Type 2 diabetes mellitus with diabetic chronic kidney disease: Secondary | ICD-10-CM | POA: Diagnosis not present

## 2021-07-01 DIAGNOSIS — I700 Atherosclerosis of aorta: Secondary | ICD-10-CM | POA: Diagnosis not present

## 2021-07-01 DIAGNOSIS — F3340 Major depressive disorder, recurrent, in remission, unspecified: Secondary | ICD-10-CM | POA: Diagnosis not present

## 2021-09-09 ENCOUNTER — Ambulatory Visit (HOSPITAL_COMMUNITY)
Admission: RE | Admit: 2021-09-09 | Discharge: 2021-09-09 | Disposition: A | Discharge status: Home / Self Care | Payer: Medicare PPO | Source: Ambulatory Visit | Attending: Internal Medicine | Admitting: Internal Medicine

## 2021-09-09 ENCOUNTER — Other Ambulatory Visit (HOSPITAL_COMMUNITY): Payer: Self-pay | Admitting: Internal Medicine

## 2021-09-09 DIAGNOSIS — M25551 Pain in right hip: Secondary | ICD-10-CM

## 2021-10-25 DIAGNOSIS — Z79899 Other long term (current) drug therapy: Secondary | ICD-10-CM | POA: Diagnosis not present

## 2021-10-25 DIAGNOSIS — F32A Depression, unspecified: Secondary | ICD-10-CM | POA: Diagnosis not present

## 2021-10-25 DIAGNOSIS — I700 Atherosclerosis of aorta: Secondary | ICD-10-CM | POA: Diagnosis not present

## 2021-10-25 DIAGNOSIS — N1831 Chronic kidney disease, stage 3a: Secondary | ICD-10-CM | POA: Diagnosis not present

## 2021-10-25 DIAGNOSIS — I10 Essential (primary) hypertension: Secondary | ICD-10-CM | POA: Diagnosis not present

## 2021-10-25 DIAGNOSIS — E1129 Type 2 diabetes mellitus with other diabetic kidney complication: Secondary | ICD-10-CM | POA: Diagnosis not present

## 2021-11-01 DIAGNOSIS — N1831 Chronic kidney disease, stage 3a: Secondary | ICD-10-CM | POA: Diagnosis not present

## 2021-11-01 DIAGNOSIS — I10 Essential (primary) hypertension: Secondary | ICD-10-CM | POA: Diagnosis not present

## 2021-11-01 DIAGNOSIS — E1122 Type 2 diabetes mellitus with diabetic chronic kidney disease: Secondary | ICD-10-CM | POA: Diagnosis not present

## 2021-11-01 DIAGNOSIS — R7309 Other abnormal glucose: Secondary | ICD-10-CM | POA: Diagnosis not present

## 2021-11-02 ENCOUNTER — Ambulatory Visit (HOSPITAL_COMMUNITY)
Admission: RE | Admit: 2021-11-02 | Discharge: 2021-11-02 | Disposition: A | Discharge status: Home / Self Care | Payer: Medicare PPO | Source: Ambulatory Visit | Attending: Internal Medicine | Admitting: Internal Medicine

## 2021-11-02 ENCOUNTER — Other Ambulatory Visit (HOSPITAL_COMMUNITY): Payer: Self-pay | Admitting: Internal Medicine

## 2021-11-02 DIAGNOSIS — R109 Unspecified abdominal pain: Secondary | ICD-10-CM | POA: Diagnosis not present

## 2021-11-02 DIAGNOSIS — N23 Unspecified renal colic: Secondary | ICD-10-CM | POA: Diagnosis not present

## 2022-01-14 ENCOUNTER — Ambulatory Visit
Admission: EM | Admit: 2022-01-14 | Discharge: 2022-01-14 | Disposition: A | Discharge status: Home / Self Care | Payer: Medicare PPO

## 2022-01-14 DIAGNOSIS — M7918 Myalgia, other site: Secondary | ICD-10-CM

## 2022-01-14 DIAGNOSIS — G44319 Acute post-traumatic headache, not intractable: Secondary | ICD-10-CM | POA: Diagnosis not present

## 2022-01-14 NOTE — Discharge Instructions (Signed)
Apply heat, lidocaine patches, stretches to the left low back and buttock region.  Follow-up with your primary care provider as soon as possible for recheck.  Go to the emergency department any point if worsening.

## 2022-01-14 NOTE — ED Provider Notes (Signed)
[facility]    CSN: [id number] Arrival date & time: 01/14/22  1550      History   Chief Complaint Chief Complaint  Patient presents with   Back Pain   Fall         HPI Lenise P Sutcliffe is a 88 y.o. female.   Presenting today for evaluation of a fall about 2 weeks ago onto concrete where she hit the back of her head and her left buttock.  She states initially her head bled quite a bit but that has resolved completely.  She did not lose consciousness and has had no subsequent dizziness, syncopal episodes, vision changes, mental status changes, nausea, vomiting.  She has had soreness in her left posterior buttock region, denies any range of motion loss, radiation of pain down leg, numbness, weakness, tingling, bowel or bladder incontinence, back pain, bruising or abrasions.  Taking Tylenol as needed with mild temporary relief.  Her main concerns today are the buttock pain and some right temple pain that has been present since the fall.    Past Medical History:  Diagnosis Date   Atrial flutter (HCC)    Depression    Dupuytren's contracture of both hands    Hypertension    Irregular heart beat    Kidney stone     Patient Active Problem List   Diagnosis Date Noted   Trigeminal neuralgia of left side of face 09/11/2019   Closed right ankle fracture 07/03/2014   Trimalleolar fracture of ankle, closed 07/02/2014    Past Surgical History:  Procedure Laterality Date   ABDOMINAL HYSTERECTOMY     ANKLE CLOSED REDUCTION Right 07/03/2014   Procedure: CLOSED  REDUCTION ANKLE  WITH CAST APPLICATION;  Surgeon: Shelda Pal, MD;  Location: [facility];  Service: Orthopedics;  Laterality: Right;   BALLOON DILATION N/A 03/21/2012   Procedure: BALLOON DILATION;  Surgeon: Corbin Ade, MD;  Location: [facility];  Service: Endoscopy;  Laterality: N/A;  Balloon Stone Extraction   CHOLECYSTECTOMY  03/19/2012   Procedure: LAPAROSCOPIC CHOLECYSTECTOMY WITH INTRAOPERATIVE CHOLANGIOGRAM;   Surgeon: Dalia Heading, MD;  Location: [facility];  Service: General;  Laterality: N/A;  with common duct exploration   DUPUYTREN CONTRACTURE RELEASE     left hand   ERCP N/A 03/21/2012   Procedure: ENDOSCOPIC RETROGRADE CHOLANGIOPANCREATOGRAPHY (ERCP);  Surgeon: Corbin Ade, MD;  Location: [facility];  Service: Endoscopy;  Laterality: N/A;   LAPAROSCOPIC CHOLECYSTECTOMY  03/19/12   Lovell Sheehan, +IOC   ORIF ANKLE FRACTURE Right 07/05/2014   Procedure: OPEN REDUCTION INTERNAL FIXATION (ORIF) ANKLE FRACTURE;  Surgeon: Toni Arthurs, MD;  Location: [facility];  Service: Orthopedics;  Laterality: Right;   REMOVAL OF STONES N/A 03/21/2012   Procedure: REMOVAL OF STONES;  Surgeon: Corbin Ade, MD;  Location: [facility];  Service: Endoscopy;  Laterality: N/A;   SPHINCTEROTOMY N/A 03/21/2012   Procedure: SPHINCTEROTOMY;  Surgeon: Corbin Ade, MD;  Location: [facility];  Service: Endoscopy;  Laterality: N/A;    OB History   No obstetric history on file.      Home Medications    Prior to Admission medications   Medication Sig Start Date End Date Taking? Authorizing Provider  aspirin EC 325 MG EC tablet Take 1 tablet (325 mg total) by mouth daily. 07/07/14   Toni Arthurs, MD  diltiazem (CARDIZEM CD) 240 MG 24 hr capsule Take 1 capsule by mouth Daily. 02/20/12   [provider]  gabapentin (NEURONTIN) 100 MG capsule Take 1  capsule (100 mg total) by mouth 3 (three) times daily. Start 1 capsule twice daily for 1 week and then 3 times daily as tolerated 11/20/19 11/19/20  Micki Riley, MD  lisinopril (PRINIVIL,ZESTRIL) 40 MG tablet Take 40 mg by mouth daily.    [provider]  PARoxetine (PAXIL) 10 MG tablet  08/20/19   [provider]  Polyethyl Glycol-Propyl Glycol (SYSTANE) 0.4-0.3 % SOLN Place 1 drop into both eyes daily as needed. For dry eyes    [provider]    Family History History reviewed. No pertinent family history.  Social History Social History   Tobacco Use    Smoking status: Never   Smokeless tobacco: Never  Substance Use Topics   Alcohol use: No   Drug use: No     Allergies   Penicillins, Procaine, and Procaine hcl   Review of Systems Review of Systems Per HPI  Physical Exam Triage Vital Signs ED Triage Vitals  Enc Vitals Group     BP 01/14/22 1641 (!) 120/57     Pulse Rate 01/14/22 1641 66     Resp 01/14/22 1641 16     Temp 01/14/22 1641 97.6 F (36.4 C)     Temp Source 01/14/22 1641 Oral     SpO2 01/14/22 1641 94 %     Weight --      Height --      Head Circumference --      Peak Flow --      Pain Score 01/14/22 1639 0     Pain Loc --      Pain Edu? --      Excl. in GC? --    No data found.  Updated Vital Signs BP (!) 120/57 (BP Location: Right Arm)   Pulse 66   Temp 97.6 F (36.4 C) (Oral)   Resp 16   SpO2 94%   Visual Acuity Right Eye Distance:   Left Eye Distance:   Bilateral Distance:    Right Eye Near:   Left Eye Near:    Bilateral Near:     Physical Exam Vitals and nursing note reviewed.  Constitutional:      Appearance: Normal appearance. She is not ill-appearing.  HENT:     Head: Atraumatic.     Mouth/Throat:     Mouth: Mucous membranes are moist.  Eyes:     Extraocular Movements: Extraocular movements intact.     Conjunctiva/sclera: Conjunctivae normal.     Pupils: Pupils are equal, round, and reactive to light.  Cardiovascular:     Rate and Rhythm: Normal rate and regular rhythm.     Heart sounds: Normal heart sounds.  Pulmonary:     Effort: Pulmonary effort is normal.     Breath sounds: Normal breath sounds.  Musculoskeletal:        General: Tenderness and signs of injury present. No swelling. Normal range of motion.     Cervical back: Normal range of motion and neck supple.     Comments: Mild left posterior buttock tenderness to palpation.  No midline spinal tenderness to palpation, negative straight leg raise bilaterally, normal gait and range of motion at the left hip   Skin:    General: Skin is warm and dry.     Findings: No bruising or erythema.  Neurological:     General: No focal deficit present.     Mental Status: She is alert and oriented to person, place, and time.     Cranial Nerves:  No cranial nerve deficit.     Sensory: No sensory deficit.     Motor: No weakness.     Gait: Gait normal.  Psychiatric:        Mood and Affect: Mood normal.        Thought Content: Thought content normal.        Judgment: Judgment normal.      UC Treatments / Results  Labs (all labs ordered are listed, but only abnormal results are displayed) Labs Reviewed - No data to display  EKG   Radiology No results found.  Procedures Procedures (including critical care time)  Medications Ordered in UC Medications - No data to display  Initial Impression / Assessment and Plan / UC Course  I have reviewed the triage vital signs and the nursing notes.  Pertinent labs & imaging results that were available during my care of the patient were reviewed by me and considered in my medical decision making (see chart for details).     Vitals and exam very reassuring with no red flag findings today.  They are wanting a CT scan of her head given her temple pain following the fall 2 weeks ago, discussed that we were unable to perform this test here.  Recommended she follow-up with her primary care provider on Monday to discuss if this is an appropriate test to order or go to the emergency department if symptoms are worsening but no red flag findings today.  X-ray imaging deferred of the left hip, pelvis, sacral region with shared decision making as range of motion is excellent, pain appears more muscular in nature.  Discussed lidocaine patches, heat, Tylenol and follow-up with PCP.  Return for any worsening symptoms.  Final Clinical Impressions(s) / UC Diagnoses   Final diagnoses:  Left buttock pain  Acute post-traumatic headache, not intractable     Discharge  Instructions      Apply heat, lidocaine patches, stretches to the left low back and buttock region.  Follow-up with your primary care provider as soon as possible for recheck.  Go to the emergency department any point if worsening.    ED Prescriptions   None    PDMP not reviewed this encounter.   Particia Nearing, [place] 01/14/22 1745

## 2022-01-14 NOTE — ED Triage Notes (Signed)
Pt reports she fell on the yard 2 weeks ago and hit back of the head and left buttock. Pt reports dull headache and on and off right temple and right eye pain since the fall. Pt denies vision changes. Pt reports she is not having any pain at this moment.

## 2022-02-25 DIAGNOSIS — Z79899 Other long term (current) drug therapy: Secondary | ICD-10-CM | POA: Diagnosis not present

## 2022-02-25 DIAGNOSIS — E1129 Type 2 diabetes mellitus with other diabetic kidney complication: Secondary | ICD-10-CM | POA: Diagnosis not present

## 2022-02-25 DIAGNOSIS — I10 Essential (primary) hypertension: Secondary | ICD-10-CM | POA: Diagnosis not present

## 2022-02-25 DIAGNOSIS — N1831 Chronic kidney disease, stage 3a: Secondary | ICD-10-CM | POA: Diagnosis not present

## 2022-03-03 DIAGNOSIS — E1122 Type 2 diabetes mellitus with diabetic chronic kidney disease: Secondary | ICD-10-CM | POA: Diagnosis not present

## 2022-03-03 DIAGNOSIS — I10 Essential (primary) hypertension: Secondary | ICD-10-CM | POA: Diagnosis not present

## 2022-03-03 DIAGNOSIS — R7309 Other abnormal glucose: Secondary | ICD-10-CM | POA: Diagnosis not present

## 2022-03-03 DIAGNOSIS — N1831 Chronic kidney disease, stage 3a: Secondary | ICD-10-CM | POA: Diagnosis not present

## 2022-05-24 DIAGNOSIS — Z23 Encounter for immunization: Secondary | ICD-10-CM | POA: Diagnosis not present

## 2022-06-27 DIAGNOSIS — I10 Essential (primary) hypertension: Secondary | ICD-10-CM | POA: Diagnosis not present

## 2022-06-27 DIAGNOSIS — I700 Atherosclerosis of aorta: Secondary | ICD-10-CM | POA: Diagnosis not present

## 2022-06-27 DIAGNOSIS — E1129 Type 2 diabetes mellitus with other diabetic kidney complication: Secondary | ICD-10-CM | POA: Diagnosis not present

## 2022-06-27 DIAGNOSIS — N1831 Chronic kidney disease, stage 3a: Secondary | ICD-10-CM | POA: Diagnosis not present

## 2022-06-27 DIAGNOSIS — Z79899 Other long term (current) drug therapy: Secondary | ICD-10-CM | POA: Diagnosis not present

## 2022-06-27 DIAGNOSIS — F329 Major depressive disorder, single episode, unspecified: Secondary | ICD-10-CM | POA: Diagnosis not present

## 2022-07-04 DIAGNOSIS — E1122 Type 2 diabetes mellitus with diabetic chronic kidney disease: Secondary | ICD-10-CM | POA: Diagnosis not present

## 2022-07-04 DIAGNOSIS — I10 Essential (primary) hypertension: Secondary | ICD-10-CM | POA: Diagnosis not present

## 2022-07-04 DIAGNOSIS — N1831 Chronic kidney disease, stage 3a: Secondary | ICD-10-CM | POA: Diagnosis not present

## 2022-10-24 DIAGNOSIS — N1831 Chronic kidney disease, stage 3a: Secondary | ICD-10-CM | POA: Diagnosis not present

## 2022-10-24 DIAGNOSIS — I10 Essential (primary) hypertension: Secondary | ICD-10-CM | POA: Diagnosis not present

## 2022-10-24 DIAGNOSIS — E1129 Type 2 diabetes mellitus with other diabetic kidney complication: Secondary | ICD-10-CM | POA: Diagnosis not present

## 2022-10-31 DIAGNOSIS — R7309 Other abnormal glucose: Secondary | ICD-10-CM | POA: Diagnosis not present

## 2022-10-31 DIAGNOSIS — I10 Essential (primary) hypertension: Secondary | ICD-10-CM | POA: Diagnosis not present

## 2022-10-31 DIAGNOSIS — N1831 Chronic kidney disease, stage 3a: Secondary | ICD-10-CM | POA: Diagnosis not present

## 2022-10-31 DIAGNOSIS — F325 Major depressive disorder, single episode, in full remission: Secondary | ICD-10-CM | POA: Diagnosis not present

## 2022-10-31 DIAGNOSIS — E1122 Type 2 diabetes mellitus with diabetic chronic kidney disease: Secondary | ICD-10-CM | POA: Diagnosis not present

## 2023-02-20 DIAGNOSIS — L299 Pruritus, unspecified: Secondary | ICD-10-CM | POA: Diagnosis not present

## 2023-03-02 DIAGNOSIS — F329 Major depressive disorder, single episode, unspecified: Secondary | ICD-10-CM | POA: Diagnosis not present

## 2023-03-02 DIAGNOSIS — E1129 Type 2 diabetes mellitus with other diabetic kidney complication: Secondary | ICD-10-CM | POA: Diagnosis not present

## 2023-03-02 DIAGNOSIS — Z79899 Other long term (current) drug therapy: Secondary | ICD-10-CM | POA: Diagnosis not present

## 2023-03-02 DIAGNOSIS — N1831 Chronic kidney disease, stage 3a: Secondary | ICD-10-CM | POA: Diagnosis not present

## 2023-03-09 DIAGNOSIS — I10 Essential (primary) hypertension: Secondary | ICD-10-CM | POA: Diagnosis not present

## 2023-03-09 DIAGNOSIS — N183 Chronic kidney disease, stage 3 unspecified: Secondary | ICD-10-CM | POA: Diagnosis not present

## 2023-03-09 DIAGNOSIS — E1122 Type 2 diabetes mellitus with diabetic chronic kidney disease: Secondary | ICD-10-CM | POA: Diagnosis not present

## 2023-03-20 DIAGNOSIS — T8484XA Pain due to internal orthopedic prosthetic devices, implants and grafts, initial encounter: Secondary | ICD-10-CM | POA: Diagnosis not present

## 2023-03-20 DIAGNOSIS — M25571 Pain in right ankle and joints of right foot: Secondary | ICD-10-CM | POA: Diagnosis not present

## 2023-03-20 DIAGNOSIS — L282 Other prurigo: Secondary | ICD-10-CM | POA: Diagnosis not present

## 2023-04-05 DIAGNOSIS — M19042 Primary osteoarthritis, left hand: Secondary | ICD-10-CM | POA: Diagnosis not present

## 2023-04-05 DIAGNOSIS — M19041 Primary osteoarthritis, right hand: Secondary | ICD-10-CM | POA: Diagnosis not present

## 2023-04-05 DIAGNOSIS — G5603 Carpal tunnel syndrome, bilateral upper limbs: Secondary | ICD-10-CM | POA: Diagnosis not present

## 2023-05-30 DIAGNOSIS — Z23 Encounter for immunization: Secondary | ICD-10-CM | POA: Diagnosis not present

## 2023-07-04 DIAGNOSIS — I10 Essential (primary) hypertension: Secondary | ICD-10-CM | POA: Diagnosis not present

## 2023-07-04 DIAGNOSIS — M1990 Unspecified osteoarthritis, unspecified site: Secondary | ICD-10-CM | POA: Diagnosis not present

## 2023-07-04 DIAGNOSIS — E1129 Type 2 diabetes mellitus with other diabetic kidney complication: Secondary | ICD-10-CM | POA: Diagnosis not present

## 2023-07-04 DIAGNOSIS — N1831 Chronic kidney disease, stage 3a: Secondary | ICD-10-CM | POA: Diagnosis not present

## 2023-07-04 DIAGNOSIS — Z79899 Other long term (current) drug therapy: Secondary | ICD-10-CM | POA: Diagnosis not present

## 2023-07-10 DIAGNOSIS — F320 Major depressive disorder, single episode, mild: Secondary | ICD-10-CM | POA: Diagnosis not present

## 2023-07-10 DIAGNOSIS — N1831 Chronic kidney disease, stage 3a: Secondary | ICD-10-CM | POA: Diagnosis not present

## 2023-07-10 DIAGNOSIS — E1122 Type 2 diabetes mellitus with diabetic chronic kidney disease: Secondary | ICD-10-CM | POA: Diagnosis not present

## 2023-07-10 DIAGNOSIS — I10 Essential (primary) hypertension: Secondary | ICD-10-CM | POA: Diagnosis not present

## 2023-08-07 DIAGNOSIS — F331 Major depressive disorder, recurrent, moderate: Secondary | ICD-10-CM | POA: Diagnosis not present

## 2023-11-01 DIAGNOSIS — E1129 Type 2 diabetes mellitus with other diabetic kidney complication: Secondary | ICD-10-CM | POA: Diagnosis not present

## 2023-11-01 DIAGNOSIS — I10 Essential (primary) hypertension: Secondary | ICD-10-CM | POA: Diagnosis not present

## 2023-11-01 DIAGNOSIS — Z79899 Other long term (current) drug therapy: Secondary | ICD-10-CM | POA: Diagnosis not present

## 2023-11-01 DIAGNOSIS — N1831 Chronic kidney disease, stage 3a: Secondary | ICD-10-CM | POA: Diagnosis not present

## 2023-11-01 DIAGNOSIS — F329 Major depressive disorder, single episode, unspecified: Secondary | ICD-10-CM | POA: Diagnosis not present

## 2023-11-06 DIAGNOSIS — I10 Essential (primary) hypertension: Secondary | ICD-10-CM | POA: Diagnosis not present

## 2023-11-06 DIAGNOSIS — E1129 Type 2 diabetes mellitus with other diabetic kidney complication: Secondary | ICD-10-CM | POA: Diagnosis not present

## 2023-11-06 DIAGNOSIS — F331 Major depressive disorder, recurrent, moderate: Secondary | ICD-10-CM | POA: Diagnosis not present

## 2023-11-06 DIAGNOSIS — N183 Chronic kidney disease, stage 3 unspecified: Secondary | ICD-10-CM | POA: Diagnosis not present

## 2023-12-04 DIAGNOSIS — M19041 Primary osteoarthritis, right hand: Secondary | ICD-10-CM | POA: Diagnosis not present

## 2023-12-04 DIAGNOSIS — M19042 Primary osteoarthritis, left hand: Secondary | ICD-10-CM | POA: Diagnosis not present

## 2023-12-04 DIAGNOSIS — S90212A Contusion of left great toe with damage to nail, initial encounter: Secondary | ICD-10-CM | POA: Diagnosis not present

## 2023-12-20 ENCOUNTER — Other Ambulatory Visit: Payer: Self-pay

## 2023-12-20 ENCOUNTER — Encounter (HOSPITAL_COMMUNITY): Payer: Self-pay

## 2023-12-20 ENCOUNTER — Emergency Department (HOSPITAL_COMMUNITY)
Admission: EM | Admit: 2023-12-20 | Discharge: 2023-12-20 | Disposition: A | Discharge status: Home / Self Care | Attending: Emergency Medicine | Admitting: Emergency Medicine

## 2023-12-20 ENCOUNTER — Ambulatory Visit: Admission: EM | Admit: 2023-12-20 | Discharge: 2023-12-20 | Disposition: A | Discharge status: Home / Self Care

## 2023-12-20 DIAGNOSIS — Z7982 Long term (current) use of aspirin: Secondary | ICD-10-CM | POA: Insufficient documentation

## 2023-12-20 DIAGNOSIS — N3000 Acute cystitis without hematuria: Secondary | ICD-10-CM | POA: Diagnosis not present

## 2023-12-20 DIAGNOSIS — R339 Retention of urine, unspecified: Secondary | ICD-10-CM

## 2023-12-20 LAB — CBC WITH DIFFERENTIAL/PLATELET
Abs Immature Granulocytes: 0.02 K/uL (ref 0.00–0.07)
Basophils Absolute: 0.1 K/uL (ref 0.0–0.1)
Basophils Relative: 1 %
Eosinophils Absolute: 0.2 K/uL (ref 0.0–0.5)
Eosinophils Relative: 3 %
HCT: 37.2 % (ref 36.0–46.0)
Hemoglobin: 11.9 g/dL — ABNORMAL LOW (ref 12.0–15.0)
Immature Granulocytes: 0 %
Lymphocytes Relative: 32 %
Lymphs Abs: 2.2 K/uL (ref 0.7–4.0)
MCH: 31.1 pg (ref 26.0–34.0)
MCHC: 32 g/dL (ref 30.0–36.0)
MCV: 97.1 fL (ref 80.0–100.0)
Monocytes Absolute: 0.6 K/uL (ref 0.1–1.0)
Monocytes Relative: 8 %
Neutro Abs: 3.8 K/uL (ref 1.7–7.7)
Neutrophils Relative %: 56 %
Platelets: 204 K/uL (ref 150–400)
RBC: 3.83 MIL/uL — ABNORMAL LOW (ref 3.87–5.11)
RDW: 12.9 % (ref 11.5–15.5)
WBC: 6.9 K/uL (ref 4.0–10.5)
nRBC: 0 % (ref 0.0–0.2)

## 2023-12-20 LAB — URINALYSIS, ROUTINE W REFLEX MICROSCOPIC
Bilirubin Urine: NEGATIVE
Glucose, UA: NEGATIVE mg/dL
Hgb urine dipstick: NEGATIVE
Ketones, ur: NEGATIVE mg/dL
Nitrite: NEGATIVE
Protein, ur: NEGATIVE mg/dL
Specific Gravity, Urine: 1.009 (ref 1.005–1.030)
pH: 5 (ref 5.0–8.0)

## 2023-12-20 LAB — COMPREHENSIVE METABOLIC PANEL WITH GFR
ALT: 14 U/L (ref 0–44)
AST: 17 U/L (ref 15–41)
Albumin: 4.2 g/dL (ref 3.5–5.0)
Alkaline Phosphatase: 102 U/L (ref 38–126)
Anion gap: 13 (ref 5–15)
BUN: 25 mg/dL — ABNORMAL HIGH (ref 8–23)
CO2: 21 mmol/L — ABNORMAL LOW (ref 22–32)
Calcium: 9.6 mg/dL (ref 8.9–10.3)
Chloride: 104 mmol/L (ref 98–111)
Creatinine, Ser: 1.08 mg/dL — ABNORMAL HIGH (ref 0.44–1.00)
GFR, Estimated: 47 mL/min — ABNORMAL LOW (ref >60)
Glucose, Bld: 104 mg/dL — ABNORMAL HIGH (ref 70–99)
Potassium: 4.3 mmol/L (ref 3.5–5.1)
Sodium: 138 mmol/L (ref 135–145)
Total Bilirubin: 0.6 mg/dL (ref 0.0–1.2)
Total Protein: 7.1 g/dL (ref 6.5–8.1)

## 2023-12-20 MED ORDER — SODIUM CHLORIDE 0.9 % IV BOLUS
500.0000 mL | Freq: Once | INTRAVENOUS | Status: AC
  Administered 2023-12-20: 500 mL via INTRAVENOUS

## 2023-12-20 MED ORDER — CEPHALEXIN 500 MG PO CAPS: 500.0000 mg | ORAL_CAPSULE | Freq: Two times a day (BID) | ORAL | 0 refills | Status: DC

## 2023-12-20 MED ORDER — CEPHALEXIN 500 MG PO CAPS
500.0000 mg | ORAL_CAPSULE | Freq: Once | ORAL | Status: AC
  Administered 2023-12-20: 500 mg via ORAL
  Filled 2023-12-20: qty 1

## 2023-12-20 NOTE — ED Provider Notes (Signed)
 [place] EMERGENCY DEPARTMENT AT [facility] Provider Note   CSN: [id number] Arrival date & time: 12/20/23  1609     Patient presents with: Urinary Retention   [name] is a 89 y.o. female.  The ER today with complaint of not being able to urinate since last night around midnight when she went to bed.  Woke up and was not able to pee, went to urgent care was sent here.  Fortunately she has been able to urinate since she got here and says she is feeling better.  Denies any abdominal pain or significant flank pain, says she has some mild right flank pain very briefly today that resolved on its own.  No nausea or vomiting.   HPI     Prior to Admission medications   Medication Sig Start Date End Date Taking? Authorizing Provider  cephALEXin  (KEFLEX ) 500 MG capsule Take 1 capsule (500 mg total) by mouth 2 (two) times daily. 12/20/23  Yes Manus Weedman A, PA-C  aspirin  EC 325 MG EC tablet Take 1 tablet (325 mg total) by mouth daily. 07/07/14   Kit Rush, MD  diltiazem  (CARDIZEM  CD) 240 MG 24 hr capsule Take 1 capsule by mouth Daily. 02/20/12   [provider]  lisinopril  (PRINIVIL ,ZESTRIL ) 40 MG tablet Take 40 mg by mouth daily.    [provider]  PARoxetine  (PAXIL ) 20 MG tablet Take 20 mg by mouth daily. 11/15/23   [provider]  Polyethyl Glycol-Propyl Glycol (SYSTANE) 0.4-0.3 % SOLN Place 1 drop into both eyes daily as needed. For dry eyes    [provider]    Allergies: Penicillins and Procaine    Review of Systems  Updated Vital Signs BP 138/83 (BP Location: Right Arm)   Pulse 69   Temp 97.9 F (36.6 C) (Oral)   Resp 18   Ht 5' 4 (1.626 m)   Wt 59.4 kg   SpO2 97%   BMI 22.48 kg/m   Physical Exam Vitals and nursing note reviewed.  Constitutional:      General: She is not in acute distress.    Appearance: She is well-developed.  HENT:     Head: Normocephalic and atraumatic.     Mouth/Throat:     Mouth:  Mucous membranes are moist.  Eyes:     Extraocular Movements: Extraocular movements intact.     Conjunctiva/sclera: Conjunctivae normal.     Pupils: Pupils are equal, round, and reactive to light.  Cardiovascular:     Rate and Rhythm: Normal rate and regular rhythm.     Heart sounds: No murmur heard. Pulmonary:     Effort: Pulmonary effort is normal. No respiratory distress.     Breath sounds: Normal breath sounds.  Abdominal:     Palpations: Abdomen is soft.     Tenderness: There is no abdominal tenderness.  Musculoskeletal:        General: No swelling.     Cervical back: Neck supple.  Skin:    General: Skin is warm and dry.     Capillary Refill: Capillary refill takes less than 2 seconds.  Neurological:     General: No focal deficit present.     Mental Status: She is alert and oriented to person, place, and time.  Psychiatric:        Mood and Affect: Mood normal.     (all labs ordered are listed, but only abnormal results are displayed) Labs Reviewed  URINALYSIS, ROUTINE W REFLEX MICROSCOPIC - Abnormal; Notable  for the following components:      Result Value   Leukocytes,Ua MODERATE (*)    Bacteria, UA RARE (*)    Non Squamous Epithelial 0-5 (*)    All other components within normal limits  COMPREHENSIVE METABOLIC PANEL WITH GFR - Abnormal; Notable for the following components:   CO2 21 (*)    Glucose, Bld 104 (*)    BUN 25 (*)    Creatinine, Ser 1.08 (*)    GFR, Estimated 47 (*)    All other components within normal limits  CBC WITH DIFFERENTIAL/PLATELET - Abnormal; Notable for the following components:   RBC 3.83 (*)    Hemoglobin 11.9 (*)    All other components within normal limits  URINE CULTURE    EKG: None  Radiology: No results found.   Procedures   Medications Ordered in the ED  cephALEXin  (KEFLEX ) capsule 500 mg (500 mg Oral Given 12/20/23 1914)  sodium chloride  0.9 % bolus 500 mL (0 mLs Intravenous Stopped 12/20/23 1957)                                     Medical Decision Making Diagnosis but related to urinary retention, UTI, sepsis, other Patient was able to ultimately urinate and did not need Foley catheter, does have moderate leukocytes, 16 white blood cells and rare bacteria, symptoms to started today will treat for UTI.  Given fluids for very mild AKI, encouraged on p.o. intake follow-up and return precautions.  Amount and/or Complexity of Data Reviewed Labs: ordered.  Risk Prescription drug management.        Final diagnoses:  Acute cystitis without hematuria    ED Discharge Orders          Ordered    cephALEXin  (KEFLEX ) 500 MG capsule  2 times daily        12/20/23 1911               [name] 12/20/23 2318    Suzette Pac, MD 12/22/23 1505

## 2023-12-20 NOTE — ED Notes (Signed)
Pt ambulated to bathroom in NAD

## 2023-12-20 NOTE — ED Notes (Signed)
 After 3 cups of water , patient unable to obtain urine specimen

## 2023-12-20 NOTE — ED Notes (Signed)
 Patient is being discharged from the Urgent Care and sent to the Emergency Department via POV . Per provider, patient is in need of higher level of care due to urinary retention. Patient is aware and verbalizes understanding of plan of care.  Vitals:   12/20/23 1509  BP: (!) 122/55  Pulse: 71  Resp: 20  Temp: 97.8 F (36.6 C)  SpO2: 90%

## 2023-12-20 NOTE — ED Triage Notes (Signed)
 Pt arrived via POV c/o urinary retention. Pt reports last time she was able to micturate was last night. Pt does report recent flank pain and Hx of kidney stones. Pt reports she forgot her hearing aides at home is is very HOH.

## 2023-12-20 NOTE — ED Notes (Signed)
Called lab to add on culture

## 2023-12-20 NOTE — ED Provider Notes (Signed)
 [facility]    CSN: [id number] Arrival date & time: 12/20/23  1459      History   Chief Complaint Chief Complaint  Patient presents with   Shy bladder    HPI [name] is a 89 y.o. female.   Patient senting today with urinary retention since last night.  She states she urinated prior to going to bed but has not been able to urinate since waking up this morning.  She denies significant abdominal pain, back pain, nausea, vomiting, fevers, chills, changes to medications or supplements.  Has not had a bowel movement today but states that is common for her to not have bowel movements every day.  Denies any past history of urinary tract or kidney issues and has never had something like this happen where she could not urinate.  Per chart review does appear to have a history of kidney stones but otherwise no GU history.    Past Medical History:  Diagnosis Date   Atrial flutter (HCC)    Depression    Dupuytren's contracture of both hands    Hypertension    Irregular heart beat    Kidney stone     Patient Active Problem List   Diagnosis Date Noted   Trigeminal neuralgia of left side of face 09/11/2019   Closed right ankle fracture 07/03/2014   Trimalleolar fracture of ankle, closed 07/02/2014    Past Surgical History:  Procedure Laterality Date   ABDOMINAL HYSTERECTOMY     ANKLE CLOSED REDUCTION Right 07/03/2014   Procedure: CLOSED  REDUCTION ANKLE  WITH CAST APPLICATION;  Surgeon: Donnice JONETTA Car, MD;  Location: [facility];  Service: Orthopedics;  Laterality: Right;   BALLOON DILATION N/A 03/21/2012   Procedure: BALLOON DILATION;  Surgeon: Lamar CHRISTELLA Hollingshead, MD;  Location: [facility];  Service: Endoscopy;  Laterality: N/A;  Balloon Stone Extraction   CHOLECYSTECTOMY  03/19/2012   Procedure: LAPAROSCOPIC CHOLECYSTECTOMY WITH INTRAOPERATIVE CHOLANGIOGRAM;  Surgeon: Oneil DELENA Budge, MD;  Location: [facility];  Service: General;  Laterality: N/A;  with common duct exploration    DUPUYTREN CONTRACTURE RELEASE     left hand   ERCP N/A 03/21/2012   Procedure: ENDOSCOPIC RETROGRADE CHOLANGIOPANCREATOGRAPHY (ERCP);  Surgeon: Lamar CHRISTELLA Hollingshead, MD;  Location: [facility];  Service: Endoscopy;  Laterality: N/A;   LAPAROSCOPIC CHOLECYSTECTOMY  03/19/12   Budge, +IOC   ORIF ANKLE FRACTURE Right 07/05/2014   Procedure: OPEN REDUCTION INTERNAL FIXATION (ORIF) ANKLE FRACTURE;  Surgeon: Norleen Armor, MD;  Location: [facility];  Service: Orthopedics;  Laterality: Right;   REMOVAL OF STONES N/A 03/21/2012   Procedure: REMOVAL OF STONES;  Surgeon: Lamar CHRISTELLA Hollingshead, MD;  Location: [facility];  Service: Endoscopy;  Laterality: N/A;   SPHINCTEROTOMY N/A 03/21/2012   Procedure: SPHINCTEROTOMY;  Surgeon: Lamar CHRISTELLA Hollingshead, MD;  Location: [facility];  Service: Endoscopy;  Laterality: N/A;    OB History   No obstetric history on file.      Home Medications    Prior to Admission medications   Medication Sig Start Date End Date Taking? Authorizing Provider  aspirin  EC 325 MG EC tablet Take 1 tablet (325 mg total) by mouth daily. 07/07/14   Armor Norleen, MD  diltiazem  (CARDIZEM  CD) 240 MG 24 hr capsule Take 1 capsule by mouth Daily. 02/20/12   [provider]  gabapentin  (NEURONTIN ) 100 MG capsule Take 1 capsule (100 mg total) by mouth 3 (three) times daily. Start 1 capsule twice daily for 1 week and then 3  times daily as tolerated 11/20/19 11/19/20  Rosemarie Eather RAMAN, MD  lisinopril  (PRINIVIL ,ZESTRIL ) 40 MG tablet Take 40 mg by mouth daily.    [provider]  PARoxetine  (PAXIL ) 10 MG tablet  08/20/19   [provider]  Polyethyl Glycol-Propyl Glycol (SYSTANE) 0.4-0.3 % SOLN Place 1 drop into both eyes daily as needed. For dry eyes    [provider]    Family History History reviewed. No pertinent family history.  Social History Social History   Tobacco Use   Smoking status: Never   Smokeless tobacco: Never  Substance Use Topics   Alcohol use: No   Drug use: No      Allergies   Penicillins, Procaine, and Procaine hcl   Review of Systems Review of Systems   Physical Exam Triage Vital Signs ED Triage Vitals  Encounter Vitals Group     BP 12/20/23 1509 (!) 122/55     Girls Systolic BP Percentile --      Girls Diastolic BP Percentile --      Boys Systolic BP Percentile --      Boys Diastolic BP Percentile --      Pulse Rate 12/20/23 1509 71     Resp 12/20/23 1509 20     Temp 12/20/23 1509 97.8 F (36.6 C)     Temp src --      SpO2 12/20/23 1509 90 %     Weight --      Height --      Head Circumference --      Peak Flow --      Pain Score 12/20/23 1511 0     Pain Loc --      Pain Education --      Exclude from Growth Chart --    No data found.  Updated Vital Signs BP (!) 122/55 (BP Location: Right Arm)   Pulse 71   Temp 97.8 F (36.6 C)   Resp 20   SpO2 90%   Visual Acuity Right Eye Distance:   Left Eye Distance:   Bilateral Distance:    Right Eye Near:   Left Eye Near:    Bilateral Near:      Physical exam significantly abbreviated today as decision was made early on to go to the emergency department for further evaluation. Physical Exam Vitals and nursing note reviewed.  Constitutional:      Appearance: Normal appearance. She is not ill-appearing.  HENT:     Head: Atraumatic.  Eyes:     Extraocular Movements: Extraocular movements intact.     Conjunctiva/sclera: Conjunctivae normal.  Cardiovascular:     Rate and Rhythm: Normal rate.  Pulmonary:     Effort: Pulmonary effort is normal.  Musculoskeletal:        General: Normal range of motion.     Cervical back: Normal range of motion and neck supple.  Skin:    General: Skin is warm and dry.  Neurological:     Mental Status: She is alert and oriented to person, place, and time.  Psychiatric:        Mood and Affect: Mood normal.        Thought Content: Thought content normal.        Judgment: Judgment normal.      UC Treatments / Results   Labs (all labs ordered are listed, but only abnormal results are displayed) Labs Reviewed  POCT URINE DIPSTICK    EKG   Radiology No results found.  Procedures  Procedures (including critical care time)  Medications Ordered in UC Medications - No data to display  Initial Impression / Assessment and Plan / UC Course  I have reviewed the triage vital signs and the nursing notes.  Pertinent labs & imaging results that were available during my care of the patient were reviewed by me and considered in my medical decision making (see chart for details).     Patient has not urinated for about 16 hours, multiple bottles of water  drink in clinic with attempts to obtain a urine sample without success.  Discussed it is safest at this point to go to the emergency department where she can be catheterized and have imaging done as needed, immediate labs and other workup.  She she is agreeable to this plan, has a family member with her who endorses ability to take her via private vehicle which patient agrees to.  Final Clinical Impressions(s) / UC Diagnoses   Final diagnoses:  Urinary retention   Discharge Instructions   None    ED Prescriptions   None    PDMP not reviewed this encounter.   Stuart Vernell Norris, [place] 12/20/23 [phone]

## 2023-12-20 NOTE — ED Triage Notes (Signed)
 Pt reports she has urinary urgency but she cannot void and has not voided since last night. States she had slight low back pain but current the pain is gone

## 2023-12-20 NOTE — Discharge Instructions (Signed)
 Was a pleasure taking care of you today.  You are seen in the ER for difficulty urinating.  Fortunately you are able to empty your bladder so you do not need a catheter.  Your urine does appear to have signs of an infection so we are going to treat with antibiotics.  Make sure you drink plenty of water , take the full course of antibiotics, we also gave you some IV fluids because your kidney function was slightly elevated.  Have your PCP recheck you this week.  They can make sure you are on the right antibiotics based on the urine culture.  Come back if you are not able to urinate, develop fever or pain in your back or stomach or have any other worrisome changes.

## 2023-12-23 ENCOUNTER — Telehealth (HOSPITAL_COMMUNITY): Payer: Self-pay | Admitting: Emergency Medicine

## 2023-12-23 LAB — URINE CULTURE: Culture: 70000 — AB

## 2023-12-23 MED ORDER — NITROFURANTOIN MONOHYD MACRO 100 MG PO CAPS: 100.0000 mg | ORAL_CAPSULE | Freq: Two times a day (BID) | ORAL | 0 refills | Status: AC

## 2023-12-23 MED ORDER — NITROFURANTOIN MONOHYD MACRO 100 MG PO CAPS: 100.0000 mg | ORAL_CAPSULE | Freq: Two times a day (BID) | ORAL | 0 refills | Status: DC

## 2023-12-24 ENCOUNTER — Telehealth (HOSPITAL_BASED_OUTPATIENT_CLINIC_OR_DEPARTMENT_OTHER): Payer: Self-pay | Admitting: *Deleted

## 2023-12-24 MED ORDER — CIPROFLOXACIN HCL 500 MG PO TABS: 500.0000 mg | ORAL_TABLET | Freq: Two times a day (BID) | ORAL | 0 refills | Status: DC

## 2023-12-24 NOTE — Progress Notes (Signed)
 ED Antimicrobial Stewardship Positive Culture Follow Up   [name] is an 89 y.o. female who presented to [facility] on @ADMITDT @ with a chief complaint of  Chief Complaint  [name] presents with   Urinary Retention    Recent Results (from the past 720 hours)  Urine Culture     Status: Abnormal   Collection Time: 12/20/23  4:50 PM   Specimen: Urine, Clean Catch  Result Value Ref Range Status   Specimen Description   Final    URINE, CLEAN CATCH Performed at [facility], [address] [place], [place] [address]    Special Requests   Final    NONE Performed at [facility], [address] [place], [place] [address]    Culture 70,000 COLONIES/mL ENTEROCOCCUS FAECALIS (A)  Final   Report Status 12/23/2023 FINAL  Final   Organism ID, Bacteria ENTEROCOCCUS FAECALIS (A)  Final      Susceptibility   Enterococcus faecalis - MIC*    AMPICILLIN <=2 SENSITIVE Sensitive     NITROFURANTOIN  <=16 SENSITIVE Sensitive     VANCOMYCIN  2 SENSITIVE Sensitive     * 70,000 COLONIES/mL ENTEROCOCCUS FAECALIS    [x]  Treated with Keflex , organism resistant to prescribed antimicrobial.  Notably, Dr. Cleotilde prescribed macrobid  yesterday for [name]. [name]'s renal function is borderline for macrobid  use. Some resources discuss cutoffs based on eGFR and some utilize calculated creatine clearance. [name] renal function at the time of her ED visit is borderline below the cutoff using CrCl method. There are no recent labs that I can see to assess if her renal function was acutely worse than normal.  Given [name]'s age and renal function, I discussed with MD Lowther potential alternative regimens for the Enterococcus listed above. Given [name]'s intolerance of penicillin, amoxicillin does not provide great option in the outpatient setting where [name] cannot be observed. Nitrofurantoin  is not ideal based on above.  I called microbiology lab to see if suppressed sensitivities were available to  flouroquinolones and spoke with Marolyn. She verbalized to me that culture is indeed sensitive to ciprofloxacin . While flouroquinolones can be worrisome in elderly patients, we at least have the option to renally adjust the ciprofloxacin  for this [name]. MD Lowther agreed.  I attempted to discuss this with the [name], but she requested that we discuss with her son, [name]. I attempted to call him as well, but no answer at this time. The [name] did state that he may be at church at this time.  STOP Keflex  STOP Macrobid  START: Ciprofloxacin  500mg  q24h x 5 days(Qty 5; Refills 0)  ED Provider: Corinthia Dorn Buttner, PharmD, BCPS 12/24/2023 10:39 AM ED Clinical Pharmacist -  [phone]

## 2023-12-24 NOTE — Telephone Encounter (Signed)
 Post ED Visit - Positive Culture Follow-up: Successful Patient Follow-Up  Culture assessed and recommendations reviewed by:  []  Rankin Dee, Pharm.D. []  Venetia Gully, Pharm.D., BCPS AQ-ID []  Garrel Crews, Pharm.D., BCPS []  Almarie Lunger, Pharm.D., BCPS []  [place], [address].D., BCPS, AAHIVP []  Rosaline Bihari, Pharm.D., BCPS, AAHIVP []  Vernell Meier, PharmD, BCPS []  Latanya Hint, PharmD, BCPS []  Donald Medley, PharmD, BCPS [x] Dorn Buttner,  PharmD  Positive urine culture   See rprevious note from pharmacist. Plan: Stop Keflex  and Macrobid  Start Ciprofloxacin  500mg  Q24hrs x 5 days (qty 5, 0 refills)  Changes discussed with ED provider: Greig Buttery, DO New antibiotic prescription Ciprofloxacin  500mg  Q24hrs x 5 days (qty 5, 0 refills) Called to [place], [place]. [place], [place]  Contacted patients son [name], date 12/24/23, time 1354   [name] 12/24/2023, 1:57 PM

## 2023-12-24 NOTE — Telephone Encounter (Signed)
 Antibiotics changed due to culture and sensitivity reports, fluoroquinolone called in

## 2023-12-25 ENCOUNTER — Emergency Department (HOSPITAL_COMMUNITY)
Admission: EM | Admit: 2023-12-25 | Discharge: 2023-12-25 | Disposition: A | Discharge status: Home / Self Care | Attending: Emergency Medicine | Admitting: Emergency Medicine

## 2023-12-25 ENCOUNTER — Other Ambulatory Visit: Payer: Self-pay

## 2023-12-25 ENCOUNTER — Encounter (HOSPITAL_COMMUNITY): Payer: Self-pay | Admitting: Emergency Medicine

## 2023-12-25 DIAGNOSIS — R3915 Urgency of urination: Secondary | ICD-10-CM | POA: Diagnosis not present

## 2023-12-25 DIAGNOSIS — E119 Type 2 diabetes mellitus without complications: Secondary | ICD-10-CM | POA: Insufficient documentation

## 2023-12-25 DIAGNOSIS — Z7982 Long term (current) use of aspirin: Secondary | ICD-10-CM | POA: Insufficient documentation

## 2023-12-25 DIAGNOSIS — E871 Hypo-osmolality and hyponatremia: Secondary | ICD-10-CM | POA: Diagnosis not present

## 2023-12-25 LAB — CBC
HCT: 32.3 % — ABNORMAL LOW (ref 36.0–46.0)
Hemoglobin: 10.7 g/dL — ABNORMAL LOW (ref 12.0–15.0)
MCH: 32.2 pg (ref 26.0–34.0)
MCHC: 33.1 g/dL (ref 30.0–36.0)
MCV: 97.3 fL (ref 80.0–100.0)
Platelets: 197 K/uL (ref 150–400)
RBC: 3.32 MIL/uL — ABNORMAL LOW (ref 3.87–5.11)
RDW: 13 % (ref 11.5–15.5)
WBC: 6.2 K/uL (ref 4.0–10.5)
nRBC: 0 % (ref 0.0–0.2)

## 2023-12-25 LAB — URINALYSIS, ROUTINE W REFLEX MICROSCOPIC
Bilirubin Urine: NEGATIVE
Glucose, UA: NEGATIVE mg/dL
Hgb urine dipstick: NEGATIVE
Ketones, ur: NEGATIVE mg/dL
Leukocytes,Ua: NEGATIVE
Nitrite: NEGATIVE
Protein, ur: NEGATIVE mg/dL
Specific Gravity, Urine: 1.003 — ABNORMAL LOW (ref 1.005–1.030)
pH: 6 (ref 5.0–8.0)

## 2023-12-25 LAB — BASIC METABOLIC PANEL WITH GFR
Anion gap: 9 (ref 5–15)
BUN: 30 mg/dL — ABNORMAL HIGH (ref 8–23)
CO2: 23 mmol/L (ref 22–32)
Calcium: 8.7 mg/dL — ABNORMAL LOW (ref 8.9–10.3)
Chloride: 101 mmol/L (ref 98–111)
Creatinine, Ser: 1.08 mg/dL — ABNORMAL HIGH (ref 0.44–1.00)
GFR, Estimated: 47 mL/min — ABNORMAL LOW (ref >60)
Glucose, Bld: 118 mg/dL — ABNORMAL HIGH (ref 70–99)
Potassium: 4.1 mmol/L (ref 3.5–5.1)
Sodium: 133 mmol/L — ABNORMAL LOW (ref 135–145)

## 2023-12-25 MED ORDER — FOSFOMYCIN TROMETHAMINE 3 G PO PACK
3.0000 g | PACK | Freq: Once | ORAL | Status: AC
  Administered 2023-12-25: 3 g via ORAL
  Filled 2023-12-25: qty 3

## 2023-12-25 NOTE — Discharge Instructions (Addendum)
 You were seen for your urinary tract infection in the emergency department.   You were given a long-acting medication called fosfomycin.  It is a one-time dose so you do not need to take any additional antibiotics at home.  You may stop the ciprofloxacin  that you are currently on.  Follow-up with your primary doctor in 2-3 days regarding your visit.   Return immediately to the emergency department if you experience any of the following: high fevers, severe flank pain, or any other concerning symptoms.    Thank you for visiting our Emergency Department. It was a pleasure taking care of you today.

## 2023-12-25 NOTE — ED Provider Notes (Signed)
 [facility] AT [facility] Provider Note   CSN: [id number] Arrival date & time: 12/25/23  1932     Patient presents with: Urinary Retention   [name] is a 89 y.o. female.   89 year old female with history of diabetes and UTIs who presents emergency department with urinary urgency and sensation of incomplete emptying with voiding.  History obtained per patient and her family member at the bedside.  They report since last Wednesday she has been having a sensation of urgency during the day and incomplete bladder emptying when voiding.  No fevers or chills.  No flank pain.  No abdominal pain.  Came in on 12/20/2023 and was diagnosed with UTI.  Initially sent home with Keflex  unfortunately her urine culture returned as E faecalis.  She was started on Macrobid  and then changed to ciprofloxacin .  Says that she is having persistent urgency and decided to come in for evaluation.       Prior to Admission medications   Medication Sig Start Date End Date Taking? Authorizing Provider  aspirin  EC 325 MG EC tablet Take 1 tablet (325 mg total) by mouth daily. 07/07/14   Kit Rush, MD  diltiazem  (CARDIZEM  CD) 240 MG 24 hr capsule Take 1 capsule by mouth Daily. 02/20/12   [provider]  lisinopril  (PRINIVIL ,ZESTRIL ) 40 MG tablet Take 40 mg by mouth daily.    [provider]  nitrofurantoin , macrocrystal-monohydrate, (MACROBID ) 100 MG capsule Take 1 capsule (100 mg total) by mouth 2 (two) times daily for 7 days. 12/23/23 12/30/23  Cleotilde Rogue, MD  PARoxetine  (PAXIL ) 20 MG tablet Take 20 mg by mouth daily. 11/15/23   [provider]  Polyethyl Glycol-Propyl Glycol (SYSTANE) 0.4-0.3 % SOLN Place 1 drop into both eyes daily as needed. For dry eyes    [provider]    Allergies: Penicillins and Procaine    Review of Systems  Updated Vital Signs BP (!) 124/54 (BP Location: Right Arm)   Pulse 77   Temp 98.5 F (36.9 C) (Oral)    Resp 18   Ht 5' 4 (1.626 m)   Wt 59 kg   SpO2 97%   BMI 22.33 kg/m   Physical Exam Vitals and nursing note reviewed.  Constitutional:      General: She is not in acute distress.    Appearance: She is well-developed.  HENT:     Head: Normocephalic and atraumatic.     Right Ear: External ear normal.     Left Ear: External ear normal.     Nose: Nose normal.  Eyes:     Extraocular Movements: Extraocular movements intact.     Conjunctiva/sclera: Conjunctivae normal.     Pupils: Pupils are equal, round, and reactive to light.  Abdominal:     General: Abdomen is flat. There is no distension.     Palpations: Abdomen is soft. There is no mass.     Tenderness: There is no abdominal tenderness. There is no right CVA tenderness, left CVA tenderness or guarding.  Musculoskeletal:     Cervical back: Normal range of motion and neck supple.  Neurological:     Mental Status: She is alert. Mental status is at baseline.     (all labs ordered are listed, but only abnormal results are displayed) Labs Reviewed  URINALYSIS, ROUTINE W REFLEX MICROSCOPIC - Abnormal; Notable for the following components:      Result Value   Color, Urine COLORLESS (*)    Specific Gravity,  Urine 1.003 (*)    All other components within normal limits  BASIC METABOLIC PANEL WITH GFR - Abnormal; Notable for the following components:   Sodium 133 (*)    Glucose, Bld 118 (*)    BUN 30 (*)    Creatinine, Ser 1.08 (*)    Calcium 8.7 (*)    GFR, Estimated 47 (*)    All other components within normal limits  CBC - Abnormal; Notable for the following components:   RBC 3.32 (*)    Hemoglobin 10.7 (*)    HCT 32.3 (*)    All other components within normal limits  URINE CULTURE    EKG: None  Radiology: No results found.   Procedures   Medications Ordered in the ED  fosfomycin (MONUROL ) packet 3 g (3 g Oral Given 12/25/23 2012)    Clinical Course as of 12/25/23 2115  Mon Dec 25, 2023  2053 Creatinine(!):  1.08 At baseline [RP]    Clinical Course User Index [RP] Yolande Lamar BROCKS, MD                                 Medical Decision Making Amount and/or Complexity of Data Reviewed Labs: ordered. Decision-making details documented in ED Course.  Risk Prescription drug management.   89 year old female with history of diabetes and UTIs who presents emergency department with urinary urgency and sensation of incomplete emptying with voiding.   Initial Ddx:  Urinary tract infection, bladder outlet obstruction, AKI  MDM/Course:  Patient presents emergency department with sensation of incomplete voiding and urinary urgency.  Recently was diagnosed with a E faecalis UTI and was started on Keflex  then Macrobid  and then ciprofloxacin .  Still having symptoms at this point in time.  Bladder scan shows that she is not retaining significant amount of urine (180 mL).  Do not feel that she needs a Foley catheter without small amount of urine at this point in time.  No signs of an AKI on her chemistry.  Urine does not appear to be grossly infected we given her persistent symptoms was given a dose of fosfomycin.  Will have her follow-up with her primary doctor and urology as an outpatient.  Return precautions discussed prior to discharge  This patient presents to the ED for concern of complaints listed in HPI, this involves an extensive number of treatment options, and is a complaint that carries with it a high risk of complications and morbidity. Disposition including potential need for admission considered.   Dispo: DC Home. Return precautions discussed including, but not limited to, those listed in the AVS. Allowed pt time to ask questions which were answered fully prior to dc.  Additional history obtained from family Records reviewed Outpatient Clinic Notes The following labs were independently interpreted: Chemistry and show no acute abnormality I have reviewed the patients home medications and made  adjustments as needed Social Determinants of health:  Geriatric  Portions of this note were generated with Scientist, clinical (histocompatibility and immunogenetics). Dictation errors may occur despite best attempts at proofreading.     Final diagnoses:  Urinary urgency    ED Discharge Orders     None          Yolande Lamar BROCKS, MD 12/25/23 2115

## 2023-12-25 NOTE — ED Triage Notes (Signed)
 Pt c/o urinary retention, states the last time she voided was this AM. Has the urge to go but is unable to. Diagnosed with a uti on 7/31.

## 2023-12-27 LAB — URINE CULTURE: Culture: NO GROWTH

## 2024-02-15 DIAGNOSIS — Z79899 Other long term (current) drug therapy: Secondary | ICD-10-CM | POA: Diagnosis not present

## 2024-02-15 DIAGNOSIS — E1129 Type 2 diabetes mellitus with other diabetic kidney complication: Secondary | ICD-10-CM | POA: Diagnosis not present

## 2024-02-15 DIAGNOSIS — N1831 Chronic kidney disease, stage 3a: Secondary | ICD-10-CM | POA: Diagnosis not present

## 2024-02-15 DIAGNOSIS — I10 Essential (primary) hypertension: Secondary | ICD-10-CM | POA: Diagnosis not present

## 2024-02-15 DIAGNOSIS — F329 Major depressive disorder, single episode, unspecified: Secondary | ICD-10-CM | POA: Diagnosis not present

## 2024-02-22 DIAGNOSIS — N183 Chronic kidney disease, stage 3 unspecified: Secondary | ICD-10-CM | POA: Diagnosis not present

## 2024-02-22 DIAGNOSIS — F331 Major depressive disorder, recurrent, moderate: Secondary | ICD-10-CM | POA: Diagnosis not present

## 2024-02-22 DIAGNOSIS — Z23 Encounter for immunization: Secondary | ICD-10-CM | POA: Diagnosis not present

## 2024-02-22 DIAGNOSIS — E1129 Type 2 diabetes mellitus with other diabetic kidney complication: Secondary | ICD-10-CM | POA: Diagnosis not present

## 2024-02-22 DIAGNOSIS — I10 Essential (primary) hypertension: Secondary | ICD-10-CM | POA: Diagnosis not present

## 2024-04-12 DIAGNOSIS — M79661 Pain in right lower leg: Secondary | ICD-10-CM | POA: Diagnosis not present
